# Patient Record
Sex: Female | Born: 1935
Health system: Southern US, Community
[De-identification: ages and names within clinical notes are randomized; demographics above are authoritative.]

## PROBLEM LIST (undated history)

## (undated) DIAGNOSIS — J45909 Unspecified asthma, uncomplicated: Secondary | ICD-10-CM

## (undated) HISTORY — PX: ECTOPIC PREGNANCY SURGERY: SHX613

## (undated) HISTORY — PX: TONSILLECTOMY: SUR1361

## (undated) HISTORY — PX: CATARACT EXTRACTION: SUR2

---

## 1998-06-06 ENCOUNTER — Ambulatory Visit (HOSPITAL_COMMUNITY): Admission: RE | Admit: 1998-06-06 | Discharge: 1998-06-06 | Payer: Self-pay | Admitting: *Deleted

## 1999-03-17 ENCOUNTER — Other Ambulatory Visit: Admission: RE | Admit: 1999-03-17 | Discharge: 1999-03-17 | Payer: Self-pay | Admitting: *Deleted

## 1999-06-11 ENCOUNTER — Encounter: Payer: Self-pay | Admitting: *Deleted

## 1999-06-11 ENCOUNTER — Ambulatory Visit (HOSPITAL_COMMUNITY): Admission: RE | Admit: 1999-06-11 | Discharge: 1999-06-11 | Payer: Self-pay | Admitting: *Deleted

## 2000-03-23 ENCOUNTER — Other Ambulatory Visit: Admission: RE | Admit: 2000-03-23 | Discharge: 2000-03-23 | Payer: Self-pay | Admitting: *Deleted

## 2000-06-14 ENCOUNTER — Ambulatory Visit (HOSPITAL_COMMUNITY): Admission: RE | Admit: 2000-06-14 | Discharge: 2000-06-14 | Payer: Self-pay | Admitting: *Deleted

## 2000-06-14 ENCOUNTER — Encounter: Payer: Self-pay | Admitting: *Deleted

## 2001-03-30 ENCOUNTER — Other Ambulatory Visit: Admission: RE | Admit: 2001-03-30 | Discharge: 2001-03-30 | Payer: Self-pay | Admitting: *Deleted

## 2001-06-16 ENCOUNTER — Encounter: Payer: Self-pay | Admitting: *Deleted

## 2001-06-16 ENCOUNTER — Ambulatory Visit (HOSPITAL_COMMUNITY): Admission: RE | Admit: 2001-06-16 | Discharge: 2001-06-16 | Payer: Self-pay | Admitting: *Deleted

## 2002-04-12 ENCOUNTER — Other Ambulatory Visit: Admission: RE | Admit: 2002-04-12 | Discharge: 2002-04-12 | Payer: Self-pay | Admitting: *Deleted

## 2002-06-27 ENCOUNTER — Ambulatory Visit (HOSPITAL_COMMUNITY): Admission: RE | Admit: 2002-06-27 | Discharge: 2002-06-27 | Payer: Self-pay | Admitting: *Deleted

## 2002-06-27 ENCOUNTER — Encounter: Payer: Self-pay | Admitting: *Deleted

## 2003-05-10 ENCOUNTER — Other Ambulatory Visit: Admission: RE | Admit: 2003-05-10 | Discharge: 2003-05-10 | Payer: Self-pay | Admitting: *Deleted

## 2003-06-29 ENCOUNTER — Encounter: Payer: Self-pay | Admitting: *Deleted

## 2003-06-29 ENCOUNTER — Ambulatory Visit (HOSPITAL_COMMUNITY): Admission: RE | Admit: 2003-06-29 | Discharge: 2003-06-29 | Payer: Self-pay | Admitting: *Deleted

## 2004-05-12 ENCOUNTER — Other Ambulatory Visit: Admission: RE | Admit: 2004-05-12 | Discharge: 2004-05-12 | Payer: Self-pay | Admitting: Obstetrics and Gynecology

## 2005-12-04 ENCOUNTER — Ambulatory Visit (HOSPITAL_COMMUNITY): Admission: RE | Admit: 2005-12-04 | Discharge: 2005-12-04 | Payer: Self-pay | Admitting: Gastroenterology

## 2011-12-24 DIAGNOSIS — H903 Sensorineural hearing loss, bilateral: Secondary | ICD-10-CM | POA: Diagnosis not present

## 2012-05-30 DIAGNOSIS — H2510 Age-related nuclear cataract, unspecified eye: Secondary | ICD-10-CM | POA: Diagnosis not present

## 2012-05-30 DIAGNOSIS — H26019 Infantile and juvenile cortical, lamellar, or zonular cataract, unspecified eye: Secondary | ICD-10-CM | POA: Diagnosis not present

## 2012-09-06 DIAGNOSIS — Z13820 Encounter for screening for osteoporosis: Secondary | ICD-10-CM | POA: Diagnosis not present

## 2012-09-06 DIAGNOSIS — Z130 Encounter for screening for diseases of the blood and blood-forming organs and certain disorders involving the immune mechanism: Secondary | ICD-10-CM | POA: Diagnosis not present

## 2012-09-06 DIAGNOSIS — Z01419 Encounter for gynecological examination (general) (routine) without abnormal findings: Secondary | ICD-10-CM | POA: Diagnosis not present

## 2012-09-06 DIAGNOSIS — Z1231 Encounter for screening mammogram for malignant neoplasm of breast: Secondary | ICD-10-CM | POA: Diagnosis not present

## 2012-09-06 DIAGNOSIS — M810 Age-related osteoporosis without current pathological fracture: Secondary | ICD-10-CM | POA: Diagnosis not present

## 2012-09-09 ENCOUNTER — Other Ambulatory Visit: Payer: Self-pay | Admitting: Obstetrics and Gynecology

## 2012-09-09 DIAGNOSIS — R928 Other abnormal and inconclusive findings on diagnostic imaging of breast: Secondary | ICD-10-CM

## 2012-09-14 ENCOUNTER — Ambulatory Visit
Admission: RE | Admit: 2012-09-14 | Discharge: 2012-09-14 | Disposition: A | Discharge status: Home / Self Care | Payer: Medicare Other | Source: Ambulatory Visit | Attending: Obstetrics and Gynecology | Admitting: Obstetrics and Gynecology

## 2012-09-14 DIAGNOSIS — R928 Other abnormal and inconclusive findings on diagnostic imaging of breast: Secondary | ICD-10-CM

## 2012-10-03 DIAGNOSIS — R05 Cough: Secondary | ICD-10-CM | POA: Diagnosis not present

## 2012-10-03 DIAGNOSIS — R059 Cough, unspecified: Secondary | ICD-10-CM | POA: Diagnosis not present

## 2012-10-03 DIAGNOSIS — Z23 Encounter for immunization: Secondary | ICD-10-CM | POA: Diagnosis not present

## 2012-10-17 DIAGNOSIS — R05 Cough: Secondary | ICD-10-CM | POA: Diagnosis not present

## 2012-10-17 DIAGNOSIS — R059 Cough, unspecified: Secondary | ICD-10-CM | POA: Diagnosis not present

## 2012-11-13 DIAGNOSIS — J019 Acute sinusitis, unspecified: Secondary | ICD-10-CM | POA: Diagnosis not present

## 2012-11-22 DIAGNOSIS — J321 Chronic frontal sinusitis: Secondary | ICD-10-CM | POA: Diagnosis not present

## 2012-11-22 DIAGNOSIS — M62838 Other muscle spasm: Secondary | ICD-10-CM | POA: Diagnosis not present

## 2013-03-09 DIAGNOSIS — J329 Chronic sinusitis, unspecified: Secondary | ICD-10-CM | POA: Diagnosis not present

## 2013-03-09 DIAGNOSIS — R059 Cough, unspecified: Secondary | ICD-10-CM | POA: Diagnosis not present

## 2013-03-09 DIAGNOSIS — R05 Cough: Secondary | ICD-10-CM | POA: Diagnosis not present

## 2013-06-05 DIAGNOSIS — H10509 Unspecified blepharoconjunctivitis, unspecified eye: Secondary | ICD-10-CM | POA: Diagnosis not present

## 2013-06-22 DIAGNOSIS — H2510 Age-related nuclear cataract, unspecified eye: Secondary | ICD-10-CM | POA: Diagnosis not present

## 2013-06-22 DIAGNOSIS — H26019 Infantile and juvenile cortical, lamellar, or zonular cataract, unspecified eye: Secondary | ICD-10-CM | POA: Diagnosis not present

## 2013-08-24 DIAGNOSIS — Z23 Encounter for immunization: Secondary | ICD-10-CM | POA: Diagnosis not present

## 2013-09-13 DIAGNOSIS — Z01419 Encounter for gynecological examination (general) (routine) without abnormal findings: Secondary | ICD-10-CM | POA: Diagnosis not present

## 2013-09-13 DIAGNOSIS — Z124 Encounter for screening for malignant neoplasm of cervix: Secondary | ICD-10-CM | POA: Diagnosis not present

## 2013-09-13 DIAGNOSIS — Z130 Encounter for screening for diseases of the blood and blood-forming organs and certain disorders involving the immune mechanism: Secondary | ICD-10-CM | POA: Diagnosis not present

## 2013-09-13 DIAGNOSIS — Z1231 Encounter for screening mammogram for malignant neoplasm of breast: Secondary | ICD-10-CM | POA: Diagnosis not present

## 2013-10-19 DIAGNOSIS — H10509 Unspecified blepharoconjunctivitis, unspecified eye: Secondary | ICD-10-CM | POA: Diagnosis not present

## 2013-10-30 DIAGNOSIS — R21 Rash and other nonspecific skin eruption: Secondary | ICD-10-CM | POA: Diagnosis not present

## 2013-11-17 DIAGNOSIS — J45901 Unspecified asthma with (acute) exacerbation: Secondary | ICD-10-CM | POA: Diagnosis not present

## 2013-11-20 DIAGNOSIS — J45909 Unspecified asthma, uncomplicated: Secondary | ICD-10-CM | POA: Diagnosis not present

## 2013-12-25 DIAGNOSIS — R509 Fever, unspecified: Secondary | ICD-10-CM | POA: Diagnosis not present

## 2014-01-05 DIAGNOSIS — R05 Cough: Secondary | ICD-10-CM | POA: Diagnosis not present

## 2014-01-05 DIAGNOSIS — R059 Cough, unspecified: Secondary | ICD-10-CM | POA: Diagnosis not present

## 2014-01-19 DIAGNOSIS — J45909 Unspecified asthma, uncomplicated: Secondary | ICD-10-CM | POA: Diagnosis not present

## 2014-01-19 DIAGNOSIS — R0789 Other chest pain: Secondary | ICD-10-CM | POA: Diagnosis not present

## 2014-04-12 DIAGNOSIS — Z23 Encounter for immunization: Secondary | ICD-10-CM | POA: Diagnosis not present

## 2014-05-15 DIAGNOSIS — T7840XA Allergy, unspecified, initial encounter: Secondary | ICD-10-CM | POA: Diagnosis not present

## 2014-06-23 DIAGNOSIS — L259 Unspecified contact dermatitis, unspecified cause: Secondary | ICD-10-CM | POA: Diagnosis not present

## 2014-08-13 DIAGNOSIS — IMO0002 Reserved for concepts with insufficient information to code with codable children: Secondary | ICD-10-CM | POA: Diagnosis not present

## 2014-08-13 DIAGNOSIS — H2510 Age-related nuclear cataract, unspecified eye: Secondary | ICD-10-CM | POA: Diagnosis not present

## 2014-08-15 DIAGNOSIS — H2510 Age-related nuclear cataract, unspecified eye: Secondary | ICD-10-CM | POA: Diagnosis not present

## 2014-08-15 DIAGNOSIS — IMO0002 Reserved for concepts with insufficient information to code with codable children: Secondary | ICD-10-CM | POA: Diagnosis not present

## 2014-08-22 DIAGNOSIS — IMO0002 Reserved for concepts with insufficient information to code with codable children: Secondary | ICD-10-CM | POA: Diagnosis not present

## 2014-08-22 DIAGNOSIS — H2510 Age-related nuclear cataract, unspecified eye: Secondary | ICD-10-CM | POA: Diagnosis not present

## 2014-08-29 DIAGNOSIS — H2510 Age-related nuclear cataract, unspecified eye: Secondary | ICD-10-CM | POA: Diagnosis not present

## 2014-08-29 DIAGNOSIS — IMO0002 Reserved for concepts with insufficient information to code with codable children: Secondary | ICD-10-CM | POA: Diagnosis not present

## 2014-10-12 DIAGNOSIS — Z23 Encounter for immunization: Secondary | ICD-10-CM | POA: Diagnosis not present

## 2014-10-17 DIAGNOSIS — N958 Other specified menopausal and perimenopausal disorders: Secondary | ICD-10-CM | POA: Diagnosis not present

## 2014-10-17 DIAGNOSIS — Z1231 Encounter for screening mammogram for malignant neoplasm of breast: Secondary | ICD-10-CM | POA: Diagnosis not present

## 2014-10-17 DIAGNOSIS — Z01419 Encounter for gynecological examination (general) (routine) without abnormal findings: Secondary | ICD-10-CM | POA: Diagnosis not present

## 2014-10-17 DIAGNOSIS — M8588 Other specified disorders of bone density and structure, other site: Secondary | ICD-10-CM | POA: Diagnosis not present

## 2014-12-31 DIAGNOSIS — Z961 Presence of intraocular lens: Secondary | ICD-10-CM | POA: Diagnosis not present

## 2015-01-07 ENCOUNTER — Emergency Department (HOSPITAL_COMMUNITY): Payer: No Typology Code available for payment source

## 2015-01-07 ENCOUNTER — Encounter (HOSPITAL_COMMUNITY): Payer: Self-pay | Admitting: Radiology

## 2015-01-07 ENCOUNTER — Observation Stay (HOSPITAL_COMMUNITY)
Admission: EM | Admit: 2015-01-07 | Discharge: 2015-01-09 | Disposition: A | Discharge status: Home / Self Care | Payer: No Typology Code available for payment source | Attending: General Surgery | Admitting: General Surgery

## 2015-01-07 DIAGNOSIS — J45909 Unspecified asthma, uncomplicated: Secondary | ICD-10-CM | POA: Diagnosis not present

## 2015-01-07 DIAGNOSIS — Y92488 Other paved roadways as the place of occurrence of the external cause: Secondary | ICD-10-CM | POA: Insufficient documentation

## 2015-01-07 DIAGNOSIS — R079 Chest pain, unspecified: Secondary | ICD-10-CM | POA: Diagnosis not present

## 2015-01-07 DIAGNOSIS — I700 Atherosclerosis of aorta: Secondary | ICD-10-CM | POA: Insufficient documentation

## 2015-01-07 DIAGNOSIS — Y9389 Activity, other specified: Secondary | ICD-10-CM | POA: Diagnosis not present

## 2015-01-07 DIAGNOSIS — Y998 Other external cause status: Secondary | ICD-10-CM | POA: Insufficient documentation

## 2015-01-07 DIAGNOSIS — S299XXA Unspecified injury of thorax, initial encounter: Secondary | ICD-10-CM | POA: Diagnosis not present

## 2015-01-07 DIAGNOSIS — S2220XA Unspecified fracture of sternum, initial encounter for closed fracture: Principal | ICD-10-CM | POA: Diagnosis present

## 2015-01-07 DIAGNOSIS — R002 Palpitations: Secondary | ICD-10-CM | POA: Diagnosis not present

## 2015-01-07 DIAGNOSIS — R0789 Other chest pain: Secondary | ICD-10-CM

## 2015-01-07 HISTORY — DX: Unspecified asthma, uncomplicated: J45.909

## 2015-01-07 LAB — CBC WITH DIFFERENTIAL/PLATELET
BASOS ABS: 0 10*3/uL (ref 0.0–0.1)
Basophils Relative: 0 % (ref 0–1)
EOS PCT: 5 % (ref 0–5)
Eosinophils Absolute: 0.4 10*3/uL (ref 0.0–0.7)
HCT: 42.9 % (ref 36.0–46.0)
HEMOGLOBIN: 14.2 g/dL (ref 12.0–15.0)
LYMPHS ABS: 1.9 10*3/uL (ref 0.7–4.0)
LYMPHS PCT: 26 % (ref 12–46)
MCH: 29.5 pg (ref 26.0–34.0)
MCHC: 33.1 g/dL (ref 30.0–36.0)
MCV: 89 fL (ref 78.0–100.0)
MONOS PCT: 5 % (ref 3–12)
Monocytes Absolute: 0.4 10*3/uL (ref 0.1–1.0)
Neutro Abs: 4.6 10*3/uL (ref 1.7–7.7)
Neutrophils Relative %: 64 % (ref 43–77)
Platelets: 237 10*3/uL (ref 150–400)
RBC: 4.82 MIL/uL (ref 3.87–5.11)
RDW: 12.9 % (ref 11.5–15.5)
WBC: 7.2 10*3/uL (ref 4.0–10.5)

## 2015-01-07 LAB — BASIC METABOLIC PANEL
ANION GAP: 7 (ref 5–15)
BUN: 9 mg/dL (ref 6–23)
CO2: 27 mmol/L (ref 19–32)
CREATININE: 0.85 mg/dL (ref 0.50–1.10)
Calcium: 8.9 mg/dL (ref 8.4–10.5)
Chloride: 105 mEq/L (ref 96–112)
GFR calc non Af Amer: 65 mL/min — ABNORMAL LOW (ref >90)
GFR, EST AFRICAN AMERICAN: 75 mL/min — AB (ref >90)
Glucose, Bld: 129 mg/dL — ABNORMAL HIGH (ref 70–99)
Potassium: 3.9 mmol/L (ref 3.5–5.1)
SODIUM: 139 mmol/L (ref 135–145)

## 2015-01-07 MED ORDER — ONDANSETRON HCL 4 MG/2ML IJ SOLN
4.0000 mg | Freq: Once | INTRAMUSCULAR | Status: AC
  Administered 2015-01-07: 4 mg via INTRAVENOUS
  Filled 2015-01-07: qty 2

## 2015-01-07 MED ORDER — IOHEXOL 300 MG/ML  SOLN
80.0000 mL | Freq: Once | INTRAMUSCULAR | Status: AC | PRN
  Administered 2015-01-07: 80 mL via INTRAVENOUS

## 2015-01-07 MED ORDER — OXYCODONE HCL 5 MG PO TABS
5.0000 mg | ORAL_TABLET | ORAL | Status: DC | PRN
  Administered 2015-01-07 – 2015-01-08 (×3): 5 mg via ORAL
  Filled 2015-01-07 (×3): qty 1

## 2015-01-07 MED ORDER — SODIUM CHLORIDE 0.9 % IV SOLN: 250.0000 mL | INTRAVENOUS | Status: DC | PRN

## 2015-01-07 MED ORDER — ENOXAPARIN SODIUM 40 MG/0.4ML ~~LOC~~ SOLN
40.0000 mg | SUBCUTANEOUS | Status: DC
  Administered 2015-01-07 – 2015-01-08 (×2): 40 mg via SUBCUTANEOUS
  Filled 2015-01-07 (×3): qty 0.4

## 2015-01-07 MED ORDER — FENTANYL CITRATE 0.05 MG/ML IJ SOLN
50.0000 ug | Freq: Once | INTRAMUSCULAR | Status: AC
  Administered 2015-01-07: 50 ug via INTRAVENOUS
  Filled 2015-01-07: qty 2

## 2015-01-07 MED ORDER — HYDROMORPHONE HCL 1 MG/ML IJ SOLN: 1.0000 mg | INTRAMUSCULAR | Status: DC | PRN

## 2015-01-07 MED ORDER — ONDANSETRON HCL 4 MG/2ML IJ SOLN: 4.0000 mg | Freq: Four times a day (QID) | INTRAMUSCULAR | Status: DC | PRN

## 2015-01-07 MED ORDER — ONDANSETRON HCL 4 MG PO TABS
4.0000 mg | ORAL_TABLET | Freq: Four times a day (QID) | ORAL | Status: DC | PRN
  Administered 2015-01-08: 4 mg via ORAL
  Filled 2015-01-07: qty 1

## 2015-01-07 MED ORDER — MORPHINE SULFATE 4 MG/ML IJ SOLN
4.0000 mg | Freq: Once | INTRAMUSCULAR | Status: AC
  Administered 2015-01-07: 4 mg via INTRAVENOUS
  Filled 2015-01-07: qty 1

## 2015-01-07 MED ORDER — SODIUM CHLORIDE 0.9 % IJ SOLN
3.0000 mL | Freq: Two times a day (BID) | INTRAMUSCULAR | Status: DC
  Administered 2015-01-07 – 2015-01-09 (×4): 3 mL via INTRAVENOUS

## 2015-01-07 MED ORDER — SODIUM CHLORIDE 0.9 % IJ SOLN: 3.0000 mL | INTRAMUSCULAR | Status: DC | PRN

## 2015-01-07 NOTE — ED Notes (Signed)
Patient declines pain medication at this time, states the medications make her nauseated despite Zofran coverage.
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
## 2015-01-07 NOTE — ED Notes (Signed)
Patient back from xray, c/o pain in chest again. Monitor reapplied
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
## 2015-01-07 NOTE — ED Notes (Signed)
Dr Zavitz at bedside  

## 2015-01-08 ENCOUNTER — Observation Stay (HOSPITAL_COMMUNITY): Payer: No Typology Code available for payment source

## 2015-01-08 ENCOUNTER — Encounter (HOSPITAL_COMMUNITY): Payer: Self-pay | Admitting: General Practice

## 2015-01-08 DIAGNOSIS — S2220XD Unspecified fracture of sternum, subsequent encounter for fracture with routine healing: Secondary | ICD-10-CM | POA: Diagnosis not present

## 2015-01-08 DIAGNOSIS — S2220XA Unspecified fracture of sternum, initial encounter for closed fracture: Secondary | ICD-10-CM | POA: Diagnosis not present

## 2015-01-08 MED ORDER — MORPHINE SULFATE 2 MG/ML IJ SOLN: 2.0000 mg | INTRAMUSCULAR | Status: DC | PRN

## 2015-01-08 MED ORDER — NAPROXEN 500 MG PO TABS
500.0000 mg | ORAL_TABLET | Freq: Two times a day (BID) | ORAL | Status: DC
  Administered 2015-01-09: 500 mg via ORAL
  Filled 2015-01-08 (×4): qty 1

## 2015-01-08 MED ORDER — TRAMADOL HCL 50 MG PO TABS
50.0000 mg | ORAL_TABLET | Freq: Four times a day (QID) | ORAL | Status: DC | PRN
  Administered 2015-01-08: 100 mg via ORAL
  Administered 2015-01-08: 50 mg via ORAL
  Administered 2015-01-09: 100 mg via ORAL
  Filled 2015-01-08: qty 2
  Filled 2015-01-08: qty 1
  Filled 2015-01-08: qty 2

## 2015-01-08 MED ORDER — PROMETHAZINE HCL 25 MG/ML IJ SOLN
12.5000 mg | Freq: Once | INTRAMUSCULAR | Status: AC
  Administered 2015-01-08: 12.5 mg via INTRAVENOUS
  Filled 2015-01-08: qty 1

## 2015-01-08 NOTE — Progress Notes (Signed)
UR completed 
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
## 2015-01-09 DIAGNOSIS — S2220XA Unspecified fracture of sternum, initial encounter for closed fracture: Secondary | ICD-10-CM | POA: Diagnosis not present

## 2015-01-09 MED ORDER — NAPROXEN 500 MG PO TABS
500.0000 mg | ORAL_TABLET | Freq: Two times a day (BID) | ORAL | Status: DC
Start: 2015-01-09 — End: 2022-04-02

## 2015-01-09 MED ORDER — HYDROCODONE-ACETAMINOPHEN 10-325 MG PO TABS: 0.5000 | ORAL_TABLET | ORAL | Status: DC | PRN

## 2015-01-09 MED ORDER — ONDANSETRON HCL 4 MG PO TABS
4.0000 mg | ORAL_TABLET | ORAL | Status: DC
  Administered 2015-01-09 (×2): 4 mg via ORAL
  Filled 2015-01-09 (×2): qty 1

## 2015-01-09 MED ORDER — TRAMADOL HCL 50 MG PO TABS: 100.0000 mg | ORAL_TABLET | Freq: Four times a day (QID) | ORAL | Status: DC

## 2015-01-09 MED ORDER — TRAMADOL HCL 50 MG PO TABS
100.0000 mg | ORAL_TABLET | Freq: Four times a day (QID) | ORAL | Status: DC
  Administered 2015-01-09 (×2): 100 mg via ORAL
  Filled 2015-01-09 (×2): qty 2

## 2015-01-09 MED ORDER — HYDROCODONE-ACETAMINOPHEN 5-325 MG PO TABS: 1.0000 | ORAL_TABLET | ORAL | Status: DC | PRN

## 2015-01-09 MED ORDER — ONDANSETRON HCL 4 MG PO TABS: 4.0000 mg | ORAL_TABLET | ORAL | Status: DC

## 2015-01-09 NOTE — Discharge Instructions (Signed)
Increase activity as pain allows.
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
## 2015-01-09 NOTE — Progress Notes (Signed)
UR completed 

## 2015-01-10 ENCOUNTER — Telehealth (HOSPITAL_COMMUNITY): Payer: Self-pay

## 2015-01-10 NOTE — Telephone Encounter (Signed)
They had not seen the hard copy of one of their prescriptions but had taken care of it prior to my call.

## 2015-01-14 DIAGNOSIS — S2222XD Fracture of body of sternum, subsequent encounter for fracture with routine healing: Secondary | ICD-10-CM | POA: Diagnosis not present

## 2015-01-28 DIAGNOSIS — R079 Chest pain, unspecified: Secondary | ICD-10-CM | POA: Diagnosis not present

## 2015-01-28 DIAGNOSIS — I499 Cardiac arrhythmia, unspecified: Secondary | ICD-10-CM | POA: Diagnosis not present

## 2015-02-18 DIAGNOSIS — I499 Cardiac arrhythmia, unspecified: Secondary | ICD-10-CM | POA: Diagnosis not present

## 2015-02-18 DIAGNOSIS — R5381 Other malaise: Secondary | ICD-10-CM | POA: Diagnosis not present

## 2015-02-26 DIAGNOSIS — M545 Low back pain: Secondary | ICD-10-CM | POA: Diagnosis not present

## 2015-02-27 ENCOUNTER — Ambulatory Visit: Payer: Medicare Other | Attending: Family Medicine

## 2015-02-27 DIAGNOSIS — R6889 Other general symptoms and signs: Secondary | ICD-10-CM | POA: Diagnosis not present

## 2015-02-27 DIAGNOSIS — M545 Low back pain, unspecified: Secondary | ICD-10-CM

## 2015-02-27 DIAGNOSIS — J45909 Unspecified asthma, uncomplicated: Secondary | ICD-10-CM | POA: Diagnosis not present
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
## 2015-02-27 NOTE — Patient Instructions (Signed)
Perform all exercises below:  Hold _20___ seconds. Repeat _3___ times.  Do __3__ sessions per day. CAUTION: Movement should be gentle, steady and slow.  Knee to Chest  Lying supine, bend involved knee to chest. Perform with each leg.  Copyright  VHI. All rights reserved.  Double Knee to Chest (Flexion)   Gently pull both knees toward chest. Feel stretch in lower back or buttock area. Breathing deeply, Lumbar Rotation: Caudal - Bilateral (Supine)  Feet and knees together, arms outstretched, rotate knees left, turning head in opposite direction, until stretch is felt.

## 2015-03-04 DIAGNOSIS — J069 Acute upper respiratory infection, unspecified: Secondary | ICD-10-CM | POA: Diagnosis not present

## 2015-03-06 ENCOUNTER — Ambulatory Visit: Payer: Medicare Other | Admitting: Physical Therapy

## 2015-03-12 ENCOUNTER — Ambulatory Visit: Payer: Medicare Other

## 2015-03-12 DIAGNOSIS — M545 Low back pain, unspecified: Secondary | ICD-10-CM

## 2015-03-12 DIAGNOSIS — J45909 Unspecified asthma, uncomplicated: Secondary | ICD-10-CM | POA: Diagnosis not present

## 2015-03-12 DIAGNOSIS — R6889 Other general symptoms and signs: Secondary | ICD-10-CM | POA: Diagnosis not present

## 2015-03-12 NOTE — Patient Instructions (Signed)
   Lifting Principles  .Maintain proper posture and head alignment. .Slide object as close as possible before lifting. .Move obstacles out of the way. .Test before lifting; ask for help if too heavy. .Tighten stomach muscles without holding breath. .Use smooth movements; do not jerk. .Use legs to do the work, and pivot with feet. .Distribute the work load symmetrically and close to the center of trunk. .Push instead of pull whenever possible.   Squat down and hold basket close to stand. Use leg muscles to do the work.    Avoid twisting or bending back. Pivot around using foot movements, and bend at knees if needed when reaching for articles.        Getting Into / Out of Bed   Lower self to lie down on one side by raising legs and lowering head at the same time. Use arms to assist moving without twisting. Bend both knees to roll onto back if desired. To sit up, start from lying on side, and use same move-ments in reverse. Keep trunk aligned with legs.    Shift weight from front foot to back foot as item is lifted off shelf.    When leaning forward to pick object up from floor, extend one leg out behind. Keep back straight. Hold onto a sturdy support with other hand.      Sit upright, head facing forward. Try using a roll to support lower back. Keep shoulders relaxed, and avoid rounded back. Keep hips level with knees. Avoid crossing legs for long periods.    HIP: Hamstrings - Short Sitting   Rest leg on raised surface. Keep knee straight. Lift chest. Hold _20__ seconds. _3__ reps per set, _3__ sets per day.  CHEST: Doorway, Bilateral - Do this standing   Sitting in doorway, place hands on wall with elbows bent at shoulder height. Lean forward. Hold _10__ seconds. _5__ reps per set, __3_ sets per day.  Copyright  VHI. All rights reserved.   Copyright  VHI. All rights reserved.
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
## 2015-03-20 ENCOUNTER — Ambulatory Visit: Payer: Medicare Other

## 2015-03-20 DIAGNOSIS — R6889 Other general symptoms and signs: Secondary | ICD-10-CM | POA: Diagnosis not present

## 2015-03-20 DIAGNOSIS — M545 Low back pain, unspecified: Secondary | ICD-10-CM

## 2015-03-20 DIAGNOSIS — J45909 Unspecified asthma, uncomplicated: Secondary | ICD-10-CM | POA: Diagnosis not present
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
## 2015-03-20 NOTE — Patient Instructions (Signed)
KEEP HEAD IN NEUTRAL AND SHOULDERS DOWN AND RELAXED   Hold tubing in both arms. Pull arm back and past your hips elbow straight. Repeat __10__ times per set. Do __2__ sets per session. Do _1-2___ sessions per day.  Copyright  VHI. All rights reserved.     With resistive band anchored in door, grasp both ends. Keeping elbows bent, pull back, squeezing shoulder blades together. Hold _3__ seconds. Repeat _2x10___ times. Do _1-2___ sessions per day.  [email or web address].[email or web address]

## 2015-03-21 ENCOUNTER — Ambulatory Visit: Payer: Medicare Other

## 2015-03-21 DIAGNOSIS — J45909 Unspecified asthma, uncomplicated: Secondary | ICD-10-CM | POA: Diagnosis not present

## 2015-03-21 DIAGNOSIS — M545 Low back pain, unspecified: Secondary | ICD-10-CM

## 2015-03-21 DIAGNOSIS — R6889 Other general symptoms and signs: Secondary | ICD-10-CM
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
## 2015-03-25 ENCOUNTER — Ambulatory Visit: Payer: No Typology Code available for payment source | Attending: Family Medicine | Admitting: Physical Therapy

## 2015-03-25 ENCOUNTER — Encounter: Payer: Self-pay | Admitting: Physical Therapy

## 2015-03-25 DIAGNOSIS — J45909 Unspecified asthma, uncomplicated: Secondary | ICD-10-CM | POA: Insufficient documentation

## 2015-03-25 DIAGNOSIS — R6889 Other general symptoms and signs: Secondary | ICD-10-CM | POA: Diagnosis not present

## 2015-03-25 DIAGNOSIS — M545 Low back pain, unspecified: Secondary | ICD-10-CM
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
## 2015-03-27 ENCOUNTER — Encounter: Payer: Self-pay | Admitting: Physical Therapy

## 2015-03-27 ENCOUNTER — Ambulatory Visit: Payer: No Typology Code available for payment source | Admitting: Physical Therapy

## 2015-03-27 DIAGNOSIS — J45909 Unspecified asthma, uncomplicated: Secondary | ICD-10-CM | POA: Diagnosis not present

## 2015-03-27 DIAGNOSIS — M545 Low back pain, unspecified: Secondary | ICD-10-CM

## 2015-03-27 DIAGNOSIS — R6889 Other general symptoms and signs: Secondary | ICD-10-CM
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
## 2015-04-01 ENCOUNTER — Ambulatory Visit: Payer: No Typology Code available for payment source | Admitting: Physical Therapy

## 2015-04-03 ENCOUNTER — Encounter: Payer: Self-pay | Admitting: Physical Therapy

## 2015-04-03 ENCOUNTER — Ambulatory Visit: Payer: No Typology Code available for payment source | Admitting: Physical Therapy

## 2015-04-03 DIAGNOSIS — M545 Low back pain, unspecified: Secondary | ICD-10-CM

## 2015-04-03 DIAGNOSIS — R6889 Other general symptoms and signs: Secondary | ICD-10-CM

## 2015-04-03 DIAGNOSIS — J45909 Unspecified asthma, uncomplicated: Secondary | ICD-10-CM | POA: Diagnosis not present
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
## 2015-04-09 DIAGNOSIS — S2220XD Unspecified fracture of sternum, subsequent encounter for fracture with routine healing: Secondary | ICD-10-CM | POA: Diagnosis not present

## 2015-04-09 DIAGNOSIS — M791 Myalgia: Secondary | ICD-10-CM | POA: Diagnosis not present

## 2015-09-17 DIAGNOSIS — M940 Chondrocostal junction syndrome [Tietze]: Secondary | ICD-10-CM | POA: Diagnosis not present

## 2015-09-17 DIAGNOSIS — Z23 Encounter for immunization: Secondary | ICD-10-CM | POA: Diagnosis not present

## 2015-10-03 ENCOUNTER — Ambulatory Visit: Payer: No Typology Code available for payment source | Attending: Family Medicine

## 2015-10-03 DIAGNOSIS — R293 Abnormal posture: Secondary | ICD-10-CM | POA: Diagnosis not present

## 2015-10-03 DIAGNOSIS — R0781 Pleurodynia: Secondary | ICD-10-CM | POA: Insufficient documentation

## 2015-10-03 DIAGNOSIS — R0789 Other chest pain: Secondary | ICD-10-CM

## 2015-10-03 NOTE — Patient Instructions (Signed)
Cervico-Thoracic: Extension / Rotation (Sitting)    Reach across body with left arm and grasp back of chair. Gently look over right side shoulder. Hold _20___ seconds. Relax. Repeat _3___ times per set. Do ___1_ sets per session. Do _3___ sessions per day.  http://orth.exer.us/981   Copyright  VHI. All rights reserved.  Flexibility: Corner Stretch    Standing in corner with hands just above shoulder level and feet ____ inches from corner, lean forward until a comfortable stretch is felt across chest. Hold 20____ seconds. Repeat __3__ times per set. Do _1___ sets per session. Do ___3_ sessions per day.  http://orth.exer.us/343   Copyright  VHI. All rights reserved.  Flexion (Assistive)    Hold arms shoulder distance apart  and raise arms above head, keeping elbows as straight as possible. Can be done sitting or lying.  Hold 10 seconds Repeat _10___ times. Do _3___ sessions per day.  Copyright  VHI. All rights reserved.  [facility] [address] [place], [place] [address] Phone # [phone] Fax [phone]
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
## 2015-10-07 ENCOUNTER — Encounter: Payer: Self-pay | Admitting: Physical Therapy

## 2015-10-07 ENCOUNTER — Ambulatory Visit: Payer: No Typology Code available for payment source | Admitting: Physical Therapy

## 2015-10-07 DIAGNOSIS — R293 Abnormal posture: Secondary | ICD-10-CM | POA: Diagnosis not present

## 2015-10-07 DIAGNOSIS — R0789 Other chest pain: Secondary | ICD-10-CM

## 2015-10-07 DIAGNOSIS — R0781 Pleurodynia: Secondary | ICD-10-CM | POA: Diagnosis not present
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
## 2015-10-07 NOTE — Patient Instructions (Signed)
   AT home lay comfortably on your back, knees bent or propped up on something to help you relax.  Put your hands on your sternum bone, press gently into the bone and do small/SLOW circles. DO 5-10 clockwise then 5-10 the other direction.   Second exercise: Hands remain on your sternum. Inhale, exhale and gently press down into the bone and then push in down towards towards your belly button. Do not push hard, gentle is all you have to do. Do 5x super slow.   Diaphragmatic - Supine    Inhale through nose making navel move out toward hands. Exhale through puckered lips, hands follow navel in. Repeat _5__ times. Rest _10__ seconds between repeats. Do 3___ times per day. Then practice with your hands on your ribs, breathe out into the ribs laterally on an inhale, exhale relax. Do this 5x.   Copyright  VHI. All rights reserved.

## 2015-10-17 DIAGNOSIS — I499 Cardiac arrhythmia, unspecified: Secondary | ICD-10-CM | POA: Diagnosis not present

## 2015-10-17 DIAGNOSIS — J4520 Mild intermittent asthma, uncomplicated: Secondary | ICD-10-CM | POA: Diagnosis not present

## 2015-11-08 DIAGNOSIS — R05 Cough: Secondary | ICD-10-CM | POA: Diagnosis not present

## 2016-01-16 DIAGNOSIS — Z961 Presence of intraocular lens: Secondary | ICD-10-CM | POA: Diagnosis not present

## 2016-01-21 DIAGNOSIS — F4320 Adjustment disorder, unspecified: Secondary | ICD-10-CM | POA: Diagnosis not present

## 2016-01-21 DIAGNOSIS — R202 Paresthesia of skin: Secondary | ICD-10-CM | POA: Diagnosis not present

## 2016-05-14 ENCOUNTER — Other Ambulatory Visit: Payer: Self-pay | Admitting: Family Medicine

## 2016-05-14 DIAGNOSIS — J4520 Mild intermittent asthma, uncomplicated: Secondary | ICD-10-CM | POA: Diagnosis not present

## 2016-05-14 DIAGNOSIS — E0789 Other specified disorders of thyroid: Secondary | ICD-10-CM

## 2016-05-14 DIAGNOSIS — Z131 Encounter for screening for diabetes mellitus: Secondary | ICD-10-CM | POA: Diagnosis not present

## 2016-05-14 DIAGNOSIS — Z0000 Encounter for general adult medical examination without abnormal findings: Secondary | ICD-10-CM | POA: Diagnosis not present

## 2016-05-14 DIAGNOSIS — Z13220 Encounter for screening for lipoid disorders: Secondary | ICD-10-CM | POA: Diagnosis not present

## 2016-05-14 DIAGNOSIS — Z136 Encounter for screening for cardiovascular disorders: Secondary | ICD-10-CM | POA: Diagnosis not present

## 2016-05-14 DIAGNOSIS — C4491 Basal cell carcinoma of skin, unspecified: Secondary | ICD-10-CM | POA: Diagnosis not present

## 2016-05-28 DIAGNOSIS — C44519 Basal cell carcinoma of skin of other part of trunk: Secondary | ICD-10-CM | POA: Diagnosis not present

## 2016-05-28 DIAGNOSIS — R21 Rash and other nonspecific skin eruption: Secondary | ICD-10-CM | POA: Diagnosis not present

## 2016-06-18 ENCOUNTER — Ambulatory Visit
Admission: RE | Admit: 2016-06-18 | Discharge: 2016-06-18 | Disposition: A | Discharge status: Home / Self Care | Payer: Medicare Other | Source: Ambulatory Visit | Attending: Family Medicine | Admitting: Family Medicine

## 2016-06-18 DIAGNOSIS — E041 Nontoxic single thyroid nodule: Secondary | ICD-10-CM | POA: Diagnosis not present

## 2016-06-18 DIAGNOSIS — E0789 Other specified disorders of thyroid: Secondary | ICD-10-CM

## 2016-09-21 DIAGNOSIS — Z23 Encounter for immunization: Secondary | ICD-10-CM | POA: Diagnosis not present

## 2016-11-05 DIAGNOSIS — N390 Urinary tract infection, site not specified: Secondary | ICD-10-CM | POA: Diagnosis not present

## 2016-11-05 DIAGNOSIS — N958 Other specified menopausal and perimenopausal disorders: Secondary | ICD-10-CM | POA: Diagnosis not present

## 2016-11-05 DIAGNOSIS — Z681 Body mass index (BMI) 19 or less, adult: Secondary | ICD-10-CM | POA: Diagnosis not present

## 2016-11-05 DIAGNOSIS — Z1231 Encounter for screening mammogram for malignant neoplasm of breast: Secondary | ICD-10-CM | POA: Diagnosis not present

## 2016-11-05 DIAGNOSIS — Z01419 Encounter for gynecological examination (general) (routine) without abnormal findings: Secondary | ICD-10-CM | POA: Diagnosis not present

## 2016-11-05 DIAGNOSIS — Z124 Encounter for screening for malignant neoplasm of cervix: Secondary | ICD-10-CM | POA: Diagnosis not present

## 2016-11-25 DIAGNOSIS — M859 Disorder of bone density and structure, unspecified: Secondary | ICD-10-CM | POA: Diagnosis not present

## 2016-12-01 DIAGNOSIS — M810 Age-related osteoporosis without current pathological fracture: Secondary | ICD-10-CM | POA: Diagnosis not present

## 2016-12-25 ENCOUNTER — Emergency Department (HOSPITAL_COMMUNITY): Payer: Medicare Other

## 2016-12-25 ENCOUNTER — Encounter (HOSPITAL_COMMUNITY): Payer: Self-pay | Admitting: Emergency Medicine

## 2016-12-25 ENCOUNTER — Observation Stay (HOSPITAL_COMMUNITY)
Admission: EM | Admit: 2016-12-25 | Discharge: 2016-12-25 | Disposition: A | Discharge status: Home / Self Care | Payer: Medicare Other | Attending: Emergency Medicine | Admitting: Emergency Medicine

## 2016-12-25 DIAGNOSIS — S0990XA Unspecified injury of head, initial encounter: Secondary | ICD-10-CM | POA: Diagnosis not present

## 2016-12-25 DIAGNOSIS — R55 Syncope and collapse: Secondary | ICD-10-CM | POA: Diagnosis not present

## 2016-12-25 DIAGNOSIS — S0190XA Unspecified open wound of unspecified part of head, initial encounter: Secondary | ICD-10-CM | POA: Diagnosis not present

## 2016-12-25 DIAGNOSIS — Z791 Long term (current) use of non-steroidal anti-inflammatories (NSAID): Secondary | ICD-10-CM | POA: Insufficient documentation

## 2016-12-25 DIAGNOSIS — S0101XA Laceration without foreign body of scalp, initial encounter: Secondary | ICD-10-CM | POA: Insufficient documentation

## 2016-12-25 DIAGNOSIS — Y92512 Supermarket, store or market as the place of occurrence of the external cause: Secondary | ICD-10-CM | POA: Insufficient documentation

## 2016-12-25 DIAGNOSIS — J329 Chronic sinusitis, unspecified: Secondary | ICD-10-CM | POA: Diagnosis not present

## 2016-12-25 DIAGNOSIS — R05 Cough: Secondary | ICD-10-CM | POA: Diagnosis not present

## 2016-12-25 DIAGNOSIS — W1839XA Other fall on same level, initial encounter: Secondary | ICD-10-CM | POA: Insufficient documentation

## 2016-12-25 DIAGNOSIS — S199XXA Unspecified injury of neck, initial encounter: Secondary | ICD-10-CM | POA: Diagnosis not present

## 2016-12-25 DIAGNOSIS — Y9389 Activity, other specified: Secondary | ICD-10-CM | POA: Diagnosis not present

## 2016-12-25 LAB — URINALYSIS, ROUTINE W REFLEX MICROSCOPIC
Bilirubin Urine: NEGATIVE
Glucose, UA: NEGATIVE mg/dL
Hgb urine dipstick: NEGATIVE
Ketones, ur: 80 mg/dL — AB
Leukocytes, UA: NEGATIVE
Nitrite: NEGATIVE
Protein, ur: 100 mg/dL — AB
Specific Gravity, Urine: 1.024 (ref 1.005–1.030)
pH: 5 (ref 5.0–8.0)

## 2016-12-25 LAB — BASIC METABOLIC PANEL
ANION GAP: 12 (ref 5–15)
BUN: 13 mg/dL (ref 6–20)
CO2: 23 mmol/L (ref 22–32)
Calcium: 9 mg/dL (ref 8.9–10.3)
Chloride: 102 mmol/L (ref 101–111)
Creatinine, Ser: 0.96 mg/dL (ref 0.44–1.00)
GFR calc Af Amer: >60 mL/min (ref >60)
GFR, EST NON AFRICAN AMERICAN: 55 mL/min — AB (ref >60)
GLUCOSE: 94 mg/dL (ref 65–99)
Potassium: 4.7 mmol/L (ref 3.5–5.1)
Sodium: 137 mmol/L (ref 135–145)

## 2016-12-25 LAB — CBC
HCT: 46.2 % — ABNORMAL HIGH (ref 36.0–46.0)
Hemoglobin: 15.6 g/dL — ABNORMAL HIGH (ref 12.0–15.0)
MCH: 29.8 pg (ref 26.0–34.0)
MCHC: 33.8 g/dL (ref 30.0–36.0)
MCV: 88.2 fL (ref 78.0–100.0)
PLATELETS: 190 10*3/uL (ref 150–400)
RBC: 5.24 MIL/uL — ABNORMAL HIGH (ref 3.87–5.11)
RDW: 13.1 % (ref 11.5–15.5)
WBC: 5.2 10*3/uL (ref 4.0–10.5)

## 2016-12-25 LAB — TROPONIN I: Troponin I: <0.03 ng/mL (ref <0.03)

## 2016-12-25 LAB — CBG MONITORING, ED: GLUCOSE-CAPILLARY: 101 mg/dL — AB (ref 65–99)

## 2016-12-25 MED ORDER — FLUTICASONE PROPIONATE 50 MCG/ACT NA SUSP: 1.0000 | Freq: Every day | NASAL | 2 refills | Status: DC

## 2016-12-25 MED ORDER — IPRATROPIUM-ALBUTEROL 0.5-2.5 (3) MG/3ML IN SOLN
3.0000 mL | Freq: Once | RESPIRATORY_TRACT | Status: AC
  Administered 2016-12-25: 3 mL via RESPIRATORY_TRACT
  Filled 2016-12-25: qty 3

## 2016-12-25 MED ORDER — SODIUM CHLORIDE 0.9 % IV BOLUS (SEPSIS)
1000.0000 mL | Freq: Once | INTRAVENOUS | Status: AC
  Administered 2016-12-25: 1000 mL via INTRAVENOUS

## 2016-12-25 NOTE — ED Notes (Signed)
Patient transported to X-ray 
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
## 2016-12-25 NOTE — ED Notes (Signed)
Papers and prescriptions reviewed with patient and she verbalizes understanding. Patient was set fora dmission and got a bed quickly, however, they decided to DC instead. PA discontinued admission order
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
## 2016-12-25 NOTE — ED Notes (Signed)
Patient transported to CT 

## 2016-12-25 NOTE — Progress Notes (Addendum)
81 y/o ? Sparse medical illnesses Syncopal episode at [facility] shot of rocephin and sinus med saw pcp Dr. Harrington Challenger 12/25/16 and given rocephin and Rx azithro Felt quesy after injection and was feeling poorly and uncomfortable--had to wait a long time in the [place] for the meds Got up, went to counter, felt like needed to throw up  No other symptoms Fell backward and hit head and sustained laceration Awoke right away without any sequelae-no post-ictal state, no loss of urine, no LOC, no twitching  Wbc 5, hb 15, plt 190 Sodium 137, bun/creat 13/0.96 Ketones in urine cxr neg Ct head and spine ned  Lac repaired in ED  PMA HA in father Mother thryoid  nkda  Never smoker never drinker  High school grad Worked at [employer] 20 yr Husband passed 1 yr prior  Still drives and does for herself what is needed   eomi mncat Small laceration with staples post R occiput EOMI, ncat cta b, no added sound, no noted arrhythmias on monitor in ED Finger-nose-finger intact Power 5/5, senosry intact Reflexes 2/3  Simple vaso-vagal syncope I have evaluated the patient and had a through discussion with her about my thoughts that this is a SIMPLE VASO-VAGAL or Neuro-Cardiogenic syncope given circumstances and history--there is no real specific test for syncope but history She has ambulated around the unit 2 [>80 feet] continuously and has felt ok  Her HR is a little high She has received IVF in the ED I would recommend rpt orthostatics and follow up with PCP for staple removal ~ 7 days Flonase might also help sinus issues I had a long discussion with her and with her famiyl and we feel she is reasonably stable to d/c home  recommend follow up/discussion Dr. Harrington Challenger prior to long distance driving  Dw Mr. Marlon Pel, PA-C   Verneita Griffes, MD [facility] [phone]

## 2016-12-25 NOTE — ED Notes (Signed)
Informed pt. We needed her urine. She was unable to urinate. Will try again later

## 2017-01-01 DIAGNOSIS — J069 Acute upper respiratory infection, unspecified: Secondary | ICD-10-CM | POA: Diagnosis not present

## 2017-01-01 DIAGNOSIS — S0101XA Laceration without foreign body of scalp, initial encounter: Secondary | ICD-10-CM | POA: Diagnosis not present

## 2017-01-01 DIAGNOSIS — R55 Syncope and collapse: Secondary | ICD-10-CM | POA: Diagnosis not present

## 2017-01-13 DIAGNOSIS — S0101XD Laceration without foreign body of scalp, subsequent encounter: Secondary | ICD-10-CM | POA: Diagnosis not present

## 2017-01-13 DIAGNOSIS — R55 Syncope and collapse: Secondary | ICD-10-CM | POA: Diagnosis not present

## 2017-04-07 DIAGNOSIS — R109 Unspecified abdominal pain: Secondary | ICD-10-CM | POA: Diagnosis not present

## 2017-06-02 DIAGNOSIS — Z131 Encounter for screening for diabetes mellitus: Secondary | ICD-10-CM | POA: Diagnosis not present

## 2017-06-02 DIAGNOSIS — Z0000 Encounter for general adult medical examination without abnormal findings: Secondary | ICD-10-CM | POA: Diagnosis not present

## 2017-06-18 DIAGNOSIS — W57XXXA Bitten or stung by nonvenomous insect and other nonvenomous arthropods, initial encounter: Secondary | ICD-10-CM | POA: Diagnosis not present

## 2017-06-18 DIAGNOSIS — S1096XA Insect bite of unspecified part of neck, initial encounter: Secondary | ICD-10-CM | POA: Diagnosis not present

## 2017-07-15 DIAGNOSIS — Z961 Presence of intraocular lens: Secondary | ICD-10-CM | POA: Diagnosis not present

## 2017-09-09 DIAGNOSIS — Z23 Encounter for immunization: Secondary | ICD-10-CM | POA: Diagnosis not present

## 2018-06-22 DIAGNOSIS — S80869A Insect bite (nonvenomous), unspecified lower leg, initial encounter: Secondary | ICD-10-CM | POA: Diagnosis not present

## 2018-06-22 DIAGNOSIS — W57XXXA Bitten or stung by nonvenomous insect and other nonvenomous arthropods, initial encounter: Secondary | ICD-10-CM | POA: Diagnosis not present

## 2018-07-07 ENCOUNTER — Other Ambulatory Visit: Payer: Self-pay | Admitting: Family Medicine

## 2018-07-07 DIAGNOSIS — R636 Underweight: Secondary | ICD-10-CM | POA: Diagnosis not present

## 2018-07-07 DIAGNOSIS — Z1231 Encounter for screening mammogram for malignant neoplasm of breast: Secondary | ICD-10-CM

## 2018-07-07 DIAGNOSIS — R21 Rash and other nonspecific skin eruption: Secondary | ICD-10-CM | POA: Diagnosis not present

## 2018-07-07 DIAGNOSIS — J4520 Mild intermittent asthma, uncomplicated: Secondary | ICD-10-CM | POA: Diagnosis not present

## 2018-07-07 DIAGNOSIS — R5383 Other fatigue: Secondary | ICD-10-CM | POA: Diagnosis not present

## 2018-07-07 DIAGNOSIS — E7800 Pure hypercholesterolemia, unspecified: Secondary | ICD-10-CM | POA: Diagnosis not present

## 2018-07-07 DIAGNOSIS — I499 Cardiac arrhythmia, unspecified: Secondary | ICD-10-CM | POA: Diagnosis not present

## 2018-07-07 DIAGNOSIS — Z0000 Encounter for general adult medical examination without abnormal findings: Secondary | ICD-10-CM | POA: Diagnosis not present

## 2018-07-07 DIAGNOSIS — Z1389 Encounter for screening for other disorder: Secondary | ICD-10-CM | POA: Diagnosis not present

## 2018-07-18 DIAGNOSIS — R300 Dysuria: Secondary | ICD-10-CM | POA: Diagnosis not present

## 2018-07-18 DIAGNOSIS — R5383 Other fatigue: Secondary | ICD-10-CM | POA: Diagnosis not present

## 2018-07-18 DIAGNOSIS — M791 Myalgia, unspecified site: Secondary | ICD-10-CM | POA: Diagnosis not present

## 2018-07-18 DIAGNOSIS — E7800 Pure hypercholesterolemia, unspecified: Secondary | ICD-10-CM | POA: Diagnosis not present

## 2018-07-18 DIAGNOSIS — T466X5A Adverse effect of antihyperlipidemic and antiarteriosclerotic drugs, initial encounter: Secondary | ICD-10-CM | POA: Diagnosis not present

## 2018-07-28 DIAGNOSIS — Z961 Presence of intraocular lens: Secondary | ICD-10-CM | POA: Diagnosis not present

## 2018-08-03 ENCOUNTER — Ambulatory Visit: Payer: Medicare Other

## 2018-08-17 ENCOUNTER — Ambulatory Visit
Admission: RE | Admit: 2018-08-17 | Discharge: 2018-08-17 | Disposition: A | Discharge status: Home / Self Care | Payer: Medicare Other | Source: Ambulatory Visit | Attending: Family Medicine | Admitting: Family Medicine

## 2018-08-17 DIAGNOSIS — Z1231 Encounter for screening mammogram for malignant neoplasm of breast: Secondary | ICD-10-CM | POA: Diagnosis not present

## 2018-10-04 DIAGNOSIS — Z23 Encounter for immunization: Secondary | ICD-10-CM | POA: Diagnosis not present

## 2018-10-17 DIAGNOSIS — J019 Acute sinusitis, unspecified: Secondary | ICD-10-CM | POA: Diagnosis not present

## 2018-10-17 DIAGNOSIS — R05 Cough: Secondary | ICD-10-CM | POA: Diagnosis not present

## 2019-01-03 DIAGNOSIS — R5383 Other fatigue: Secondary | ICD-10-CM | POA: Diagnosis not present

## 2019-01-03 DIAGNOSIS — R300 Dysuria: Secondary | ICD-10-CM | POA: Diagnosis not present

## 2019-01-03 DIAGNOSIS — E7800 Pure hypercholesterolemia, unspecified: Secondary | ICD-10-CM | POA: Diagnosis not present

## 2019-01-09 DIAGNOSIS — E7800 Pure hypercholesterolemia, unspecified: Secondary | ICD-10-CM | POA: Diagnosis not present

## 2019-03-05 IMAGING — MG DIGITAL SCREENING BILATERAL MAMMOGRAM WITH TOMO AND CAD
8 series · 9 of 24 positions shown · non-contrast
Comparison: Previous exam(s).

CLINICAL DATA: Screening.

EXAM:
DIGITAL SCREENING BILATERAL MAMMOGRAM WITH TOMO AND CAD

[L MLO synth-2D]
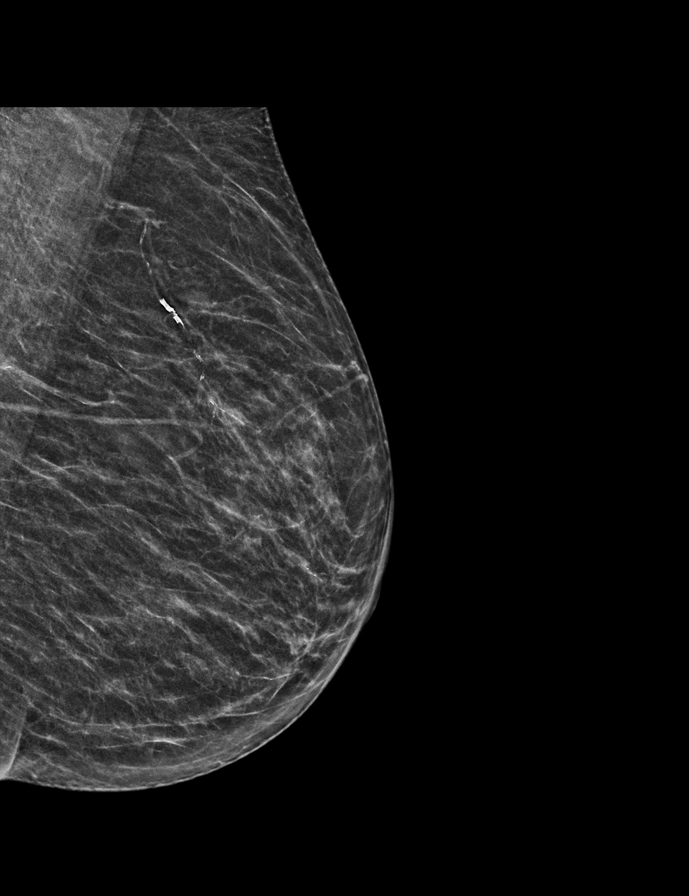

[R CC synth-2D]
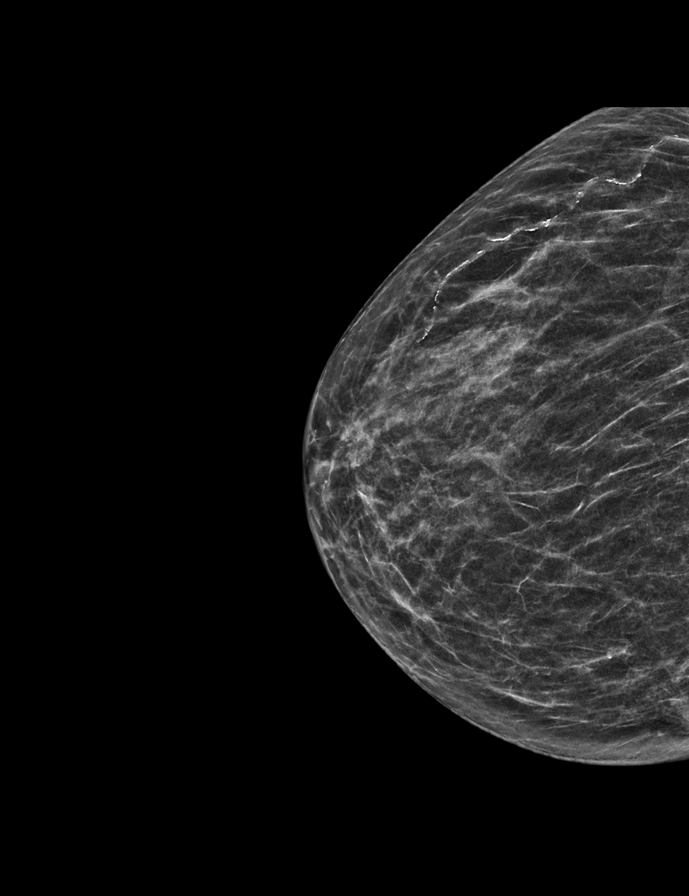

[L CC synth-2D]
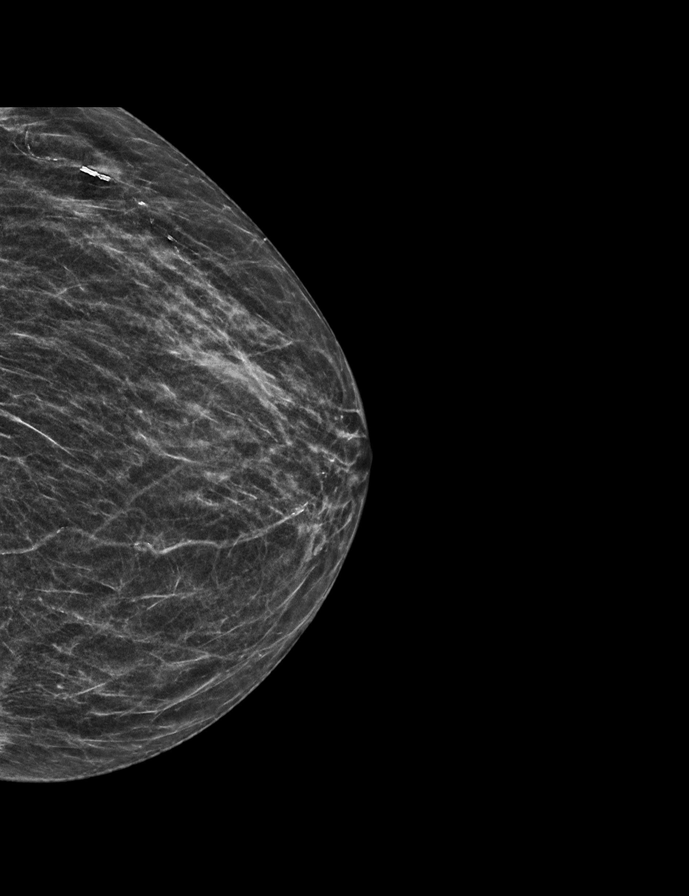

[R MLO synth-2D]
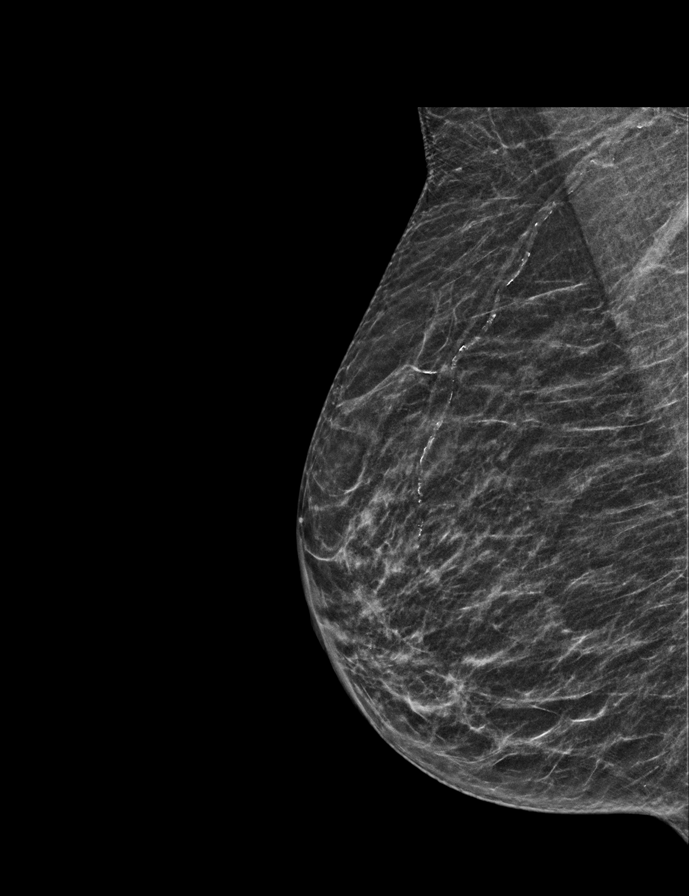

[R MLO tomo · 2 of 42 frames shown]
[frame 14/42]
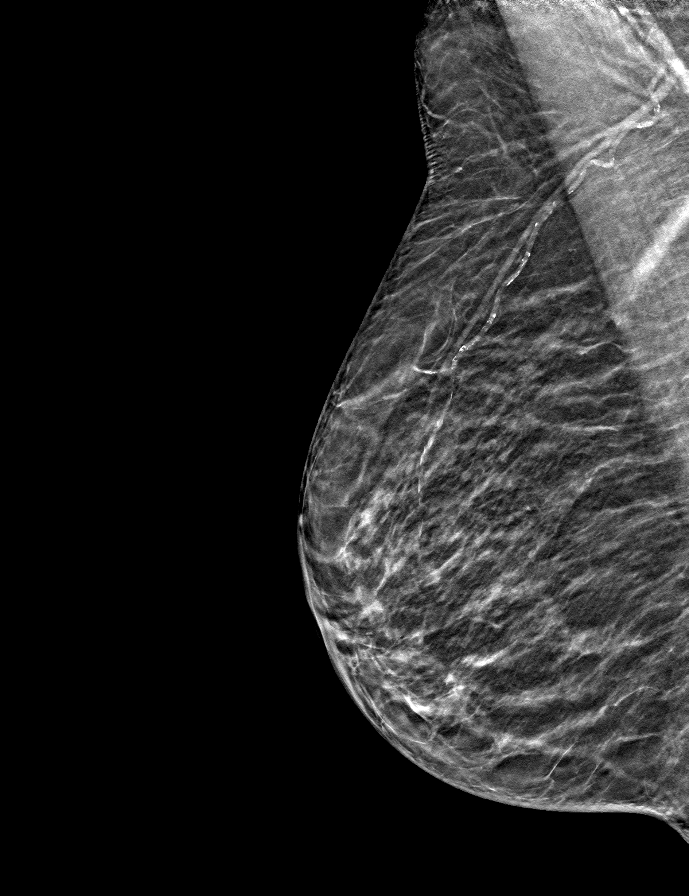
[frame 21/42]
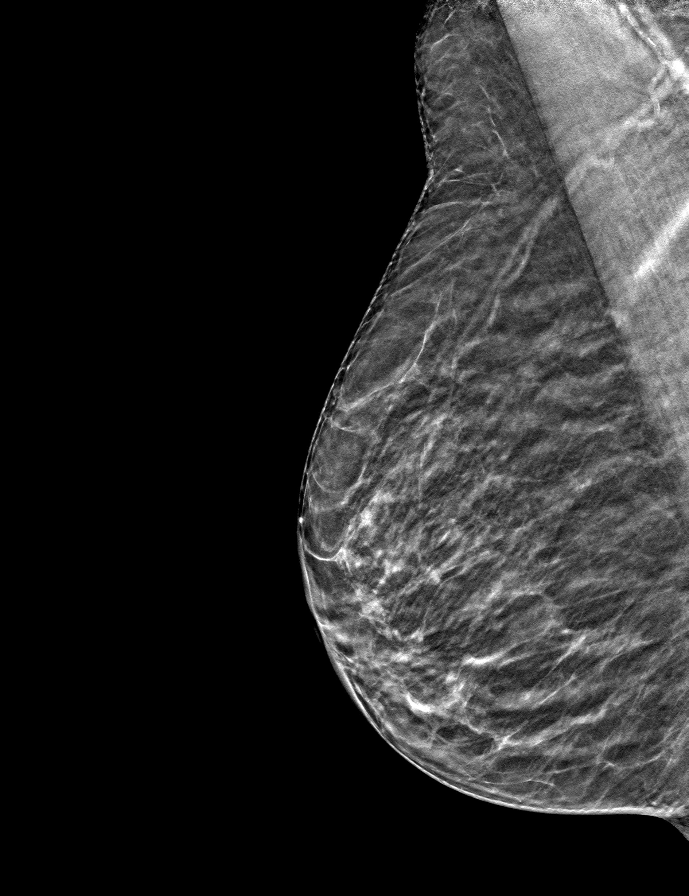

[L MLO tomo · tomo slice 21/40.0]
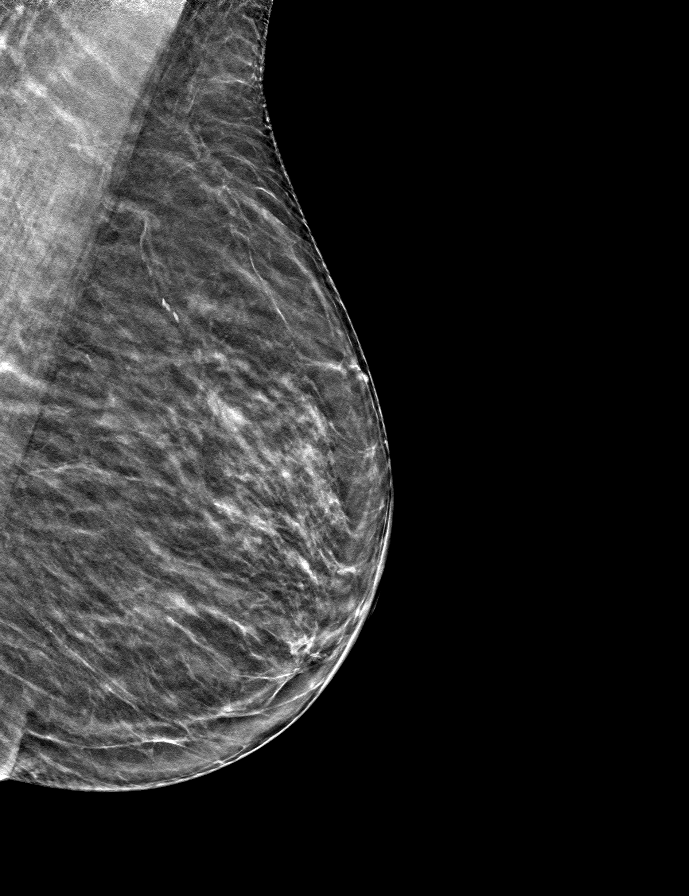

[R CC tomo · tomo slice 21/42.0]
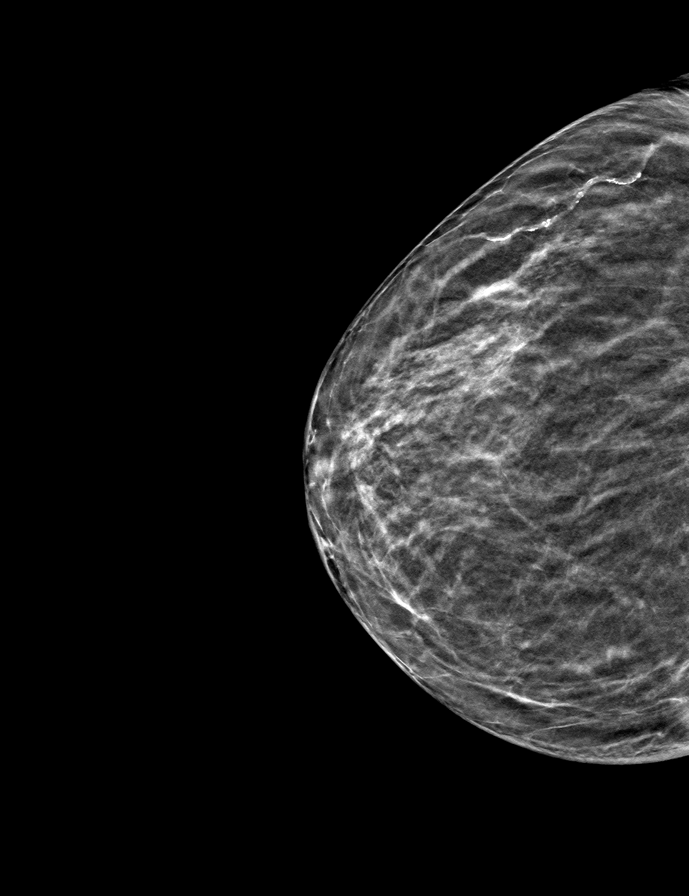

[L CC tomo · tomo slice 21/40.0]
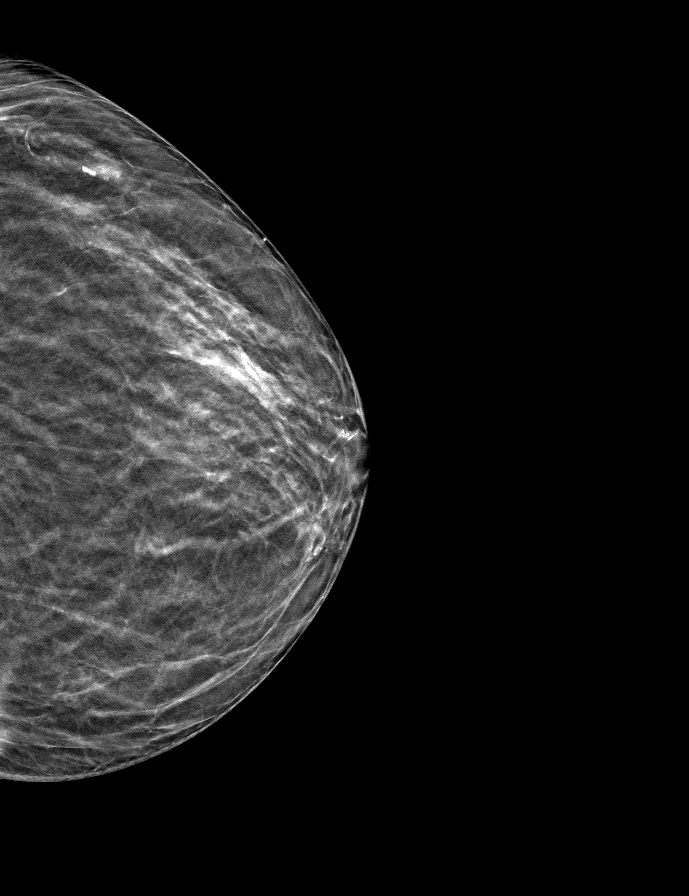

[9 of 24 positions shown; findings below may reference images not displayed]

ACR Breast Density Category b: There are scattered areas of
fibroglandular density.
FINDINGS: There are no findings suspicious for malignancy. Images were
processed with CAD.
IMPRESSION: No mammographic evidence of malignancy. A result letter of this
screening mammogram will be mailed directly to the patient.

RECOMMENDATION:
Screening mammogram in one year. (Code:[id number])

BI-RADS CATEGORY  1: Negative.

## 2019-05-11 DIAGNOSIS — Z681 Body mass index (BMI) 19 or less, adult: Secondary | ICD-10-CM | POA: Diagnosis not present

## 2019-05-11 DIAGNOSIS — R1033 Periumbilical pain: Secondary | ICD-10-CM | POA: Diagnosis not present

## 2019-05-11 DIAGNOSIS — R194 Change in bowel habit: Secondary | ICD-10-CM | POA: Diagnosis not present

## 2019-05-11 DIAGNOSIS — K219 Gastro-esophageal reflux disease without esophagitis: Secondary | ICD-10-CM | POA: Diagnosis not present

## 2019-05-18 DIAGNOSIS — R109 Unspecified abdominal pain: Secondary | ICD-10-CM | POA: Diagnosis not present

## 2019-05-23 ENCOUNTER — Other Ambulatory Visit: Payer: Self-pay | Admitting: Family Medicine

## 2019-05-23 DIAGNOSIS — R109 Unspecified abdominal pain: Secondary | ICD-10-CM

## 2019-06-06 ENCOUNTER — Ambulatory Visit
Admission: RE | Admit: 2019-06-06 | Discharge: 2019-06-06 | Disposition: A | Discharge status: Home / Self Care | Payer: Medicare Other | Source: Ambulatory Visit | Attending: Family Medicine | Admitting: Family Medicine

## 2019-06-06 ENCOUNTER — Other Ambulatory Visit: Payer: Self-pay

## 2019-06-06 DIAGNOSIS — N281 Cyst of kidney, acquired: Secondary | ICD-10-CM | POA: Diagnosis not present

## 2019-06-06 DIAGNOSIS — K7689 Other specified diseases of liver: Secondary | ICD-10-CM | POA: Diagnosis not present

## 2019-06-06 DIAGNOSIS — K824 Cholesterolosis of gallbladder: Secondary | ICD-10-CM | POA: Diagnosis not present

## 2019-06-06 DIAGNOSIS — R109 Unspecified abdominal pain: Secondary | ICD-10-CM

## 2019-06-28 DIAGNOSIS — R1314 Dysphagia, pharyngoesophageal phase: Secondary | ICD-10-CM | POA: Diagnosis not present

## 2019-06-28 DIAGNOSIS — K219 Gastro-esophageal reflux disease without esophagitis: Secondary | ICD-10-CM | POA: Diagnosis not present

## 2019-06-28 DIAGNOSIS — R1013 Epigastric pain: Secondary | ICD-10-CM | POA: Diagnosis not present

## 2019-06-29 ENCOUNTER — Other Ambulatory Visit: Payer: Self-pay | Admitting: Physician Assistant

## 2019-06-29 DIAGNOSIS — R1013 Epigastric pain: Secondary | ICD-10-CM

## 2019-06-29 DIAGNOSIS — R1314 Dysphagia, pharyngoesophageal phase: Secondary | ICD-10-CM

## 2019-06-30 DIAGNOSIS — R1013 Epigastric pain: Secondary | ICD-10-CM | POA: Diagnosis not present

## 2019-07-05 ENCOUNTER — Ambulatory Visit
Admission: RE | Admit: 2019-07-05 | Discharge: 2019-07-05 | Disposition: A | Discharge status: Home / Self Care | Payer: Medicare Other | Source: Ambulatory Visit | Attending: Physician Assistant | Admitting: Physician Assistant

## 2019-07-05 DIAGNOSIS — R1314 Dysphagia, pharyngoesophageal phase: Secondary | ICD-10-CM

## 2019-07-05 DIAGNOSIS — R1013 Epigastric pain: Secondary | ICD-10-CM

## 2019-07-05 DIAGNOSIS — K219 Gastro-esophageal reflux disease without esophagitis: Secondary | ICD-10-CM | POA: Diagnosis not present

## 2019-08-03 DIAGNOSIS — Z961 Presence of intraocular lens: Secondary | ICD-10-CM | POA: Diagnosis not present

## 2019-08-17 DIAGNOSIS — E7800 Pure hypercholesterolemia, unspecified: Secondary | ICD-10-CM | POA: Diagnosis not present

## 2019-08-17 DIAGNOSIS — Z0000 Encounter for general adult medical examination without abnormal findings: Secondary | ICD-10-CM | POA: Diagnosis not present

## 2019-09-01 DIAGNOSIS — Z0000 Encounter for general adult medical examination without abnormal findings: Secondary | ICD-10-CM | POA: Diagnosis not present

## 2019-09-01 DIAGNOSIS — Z23 Encounter for immunization: Secondary | ICD-10-CM | POA: Diagnosis not present

## 2019-09-01 DIAGNOSIS — E7800 Pure hypercholesterolemia, unspecified: Secondary | ICD-10-CM | POA: Diagnosis not present

## 2019-09-05 DIAGNOSIS — K219 Gastro-esophageal reflux disease without esophagitis: Secondary | ICD-10-CM | POA: Diagnosis not present

## 2019-12-12 DIAGNOSIS — K2930 Chronic superficial gastritis without bleeding: Secondary | ICD-10-CM | POA: Diagnosis not present

## 2019-12-28 ENCOUNTER — Other Ambulatory Visit: Payer: Self-pay | Admitting: Gastroenterology

## 2019-12-28 DIAGNOSIS — R1013 Epigastric pain: Secondary | ICD-10-CM

## 2020-01-09 ENCOUNTER — Other Ambulatory Visit: Payer: Self-pay

## 2020-01-09 ENCOUNTER — Ambulatory Visit
Admission: RE | Admit: 2020-01-09 | Discharge: 2020-01-09 | Disposition: A | Discharge status: Home / Self Care | Payer: Medicare Other | Source: Ambulatory Visit | Attending: Gastroenterology | Admitting: Gastroenterology

## 2020-01-09 DIAGNOSIS — R1013 Epigastric pain: Secondary | ICD-10-CM

## 2020-01-09 DIAGNOSIS — K7689 Other specified diseases of liver: Secondary | ICD-10-CM | POA: Diagnosis not present

## 2020-01-09 DIAGNOSIS — N281 Cyst of kidney, acquired: Secondary | ICD-10-CM | POA: Diagnosis not present

## 2020-01-09 MED ORDER — IOPAMIDOL (ISOVUE-300) INJECTION 61%
100.0000 mL | Freq: Once | INTRAVENOUS | Status: AC | PRN
  Administered 2020-01-09: 100 mL via INTRAVENOUS

## 2020-04-18 DIAGNOSIS — R0600 Dyspnea, unspecified: Secondary | ICD-10-CM | POA: Diagnosis not present

## 2020-05-07 ENCOUNTER — Encounter (INDEPENDENT_AMBULATORY_CARE_PROVIDER_SITE_OTHER): Payer: Self-pay

## 2020-05-07 ENCOUNTER — Other Ambulatory Visit: Payer: Self-pay

## 2020-05-07 ENCOUNTER — Encounter: Payer: Self-pay | Admitting: Cardiovascular Disease

## 2020-05-07 ENCOUNTER — Ambulatory Visit (INDEPENDENT_AMBULATORY_CARE_PROVIDER_SITE_OTHER): Payer: Medicare Other | Admitting: Cardiovascular Disease

## 2020-05-07 VITALS — BP 138/80 | HR 90 | Ht 60.0 in | Wt 105.0 lb

## 2020-05-07 DIAGNOSIS — R0600 Dyspnea, unspecified: Secondary | ICD-10-CM

## 2020-05-07 DIAGNOSIS — R0609 Other forms of dyspnea: Secondary | ICD-10-CM | POA: Insufficient documentation

## 2020-05-07 NOTE — Patient Instructions (Signed)
Medication Instructions:  Your physician recommends that you continue on your current medications as directed. Please refer to the Current Medication list given to you today.  *If you need a refill on your cardiac medications before your next appointment, please call your pharmacy*    Testing/Procedures: Your physician has requested that you have an echocardiogram. Echocardiography is a painless test that uses sound waves to create images of your heart. It provides your doctor with information about the size and shape of your heart and how well your heart's chambers and valves are working. This procedure takes approximately one hour. There are no restrictions for this procedure.    Follow-Up: At [facility], you and your health needs are our priority.  As part of our continuing mission to provide you with exceptional heart care, we have created designated Provider Care Teams.  These Care Teams include your primary Cardiologist (physician) and Advanced Practice Providers (APPs -  Physician Assistants and Nurse Practitioners) who all work together to provide you with the care you need, when you need it.  We recommend signing up for the patient portal called "MyChart".  Sign up information is provided on this After Visit Summary.  MyChart is used to connect with patients for Virtual Visits (Telemedicine).  Patients are able to view lab/test results, encounter notes, upcoming appointments, etc.  Non-urgent messages can be sent to your provider as well.   To learn more about what you can do with MyChart, go to [email or web address].    Your next appointment:   As needed  The format for your next appointment:   In Person  Provider:   Quay Burow, MD
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
## 2020-05-07 NOTE — Assessment & Plan Note (Signed)
Jessica Hull was referred to me by Dr. Harrington Challenger for dyspnea on exertion.  She does have a history of reactive airways disease in the past.  Over the last several months she is noticed some shortness of breath on exertion but denies chest pain.  We will get a 2D echo to further evaluate.

## 2020-05-27 ENCOUNTER — Ambulatory Visit (HOSPITAL_COMMUNITY): Payer: Medicare Other | Attending: Cardiovascular Disease

## 2020-05-27 ENCOUNTER — Other Ambulatory Visit: Payer: Self-pay

## 2020-05-27 DIAGNOSIS — R0600 Dyspnea, unspecified: Secondary | ICD-10-CM | POA: Diagnosis not present

## 2020-05-27 DIAGNOSIS — R0609 Other forms of dyspnea: Secondary | ICD-10-CM

## 2020-08-02 DIAGNOSIS — Z961 Presence of intraocular lens: Secondary | ICD-10-CM | POA: Diagnosis not present

## 2020-09-05 DIAGNOSIS — Z0000 Encounter for general adult medical examination without abnormal findings: Secondary | ICD-10-CM | POA: Diagnosis not present

## 2020-09-05 DIAGNOSIS — E7800 Pure hypercholesterolemia, unspecified: Secondary | ICD-10-CM | POA: Diagnosis not present

## 2020-09-05 DIAGNOSIS — Z131 Encounter for screening for diabetes mellitus: Secondary | ICD-10-CM | POA: Diagnosis not present

## 2020-09-05 DIAGNOSIS — Z23 Encounter for immunization: Secondary | ICD-10-CM | POA: Diagnosis not present

## 2020-09-05 DIAGNOSIS — J4520 Mild intermittent asthma, uncomplicated: Secondary | ICD-10-CM | POA: Diagnosis not present

## 2020-09-05 DIAGNOSIS — L309 Dermatitis, unspecified: Secondary | ICD-10-CM | POA: Diagnosis not present

## 2020-09-11 DIAGNOSIS — S8001XA Contusion of right knee, initial encounter: Secondary | ICD-10-CM | POA: Diagnosis not present

## 2020-09-11 DIAGNOSIS — M2560 Stiffness of unspecified joint, not elsewhere classified: Secondary | ICD-10-CM | POA: Diagnosis not present

## 2020-09-17 DIAGNOSIS — Z23 Encounter for immunization: Secondary | ICD-10-CM | POA: Diagnosis not present

## 2020-10-16 DIAGNOSIS — M79671 Pain in right foot: Secondary | ICD-10-CM | POA: Diagnosis not present

## 2020-10-16 DIAGNOSIS — R55 Syncope and collapse: Secondary | ICD-10-CM | POA: Diagnosis not present

## 2020-12-23 DIAGNOSIS — U071 COVID-19: Secondary | ICD-10-CM | POA: Diagnosis not present

## 2020-12-23 DIAGNOSIS — Z20822 Contact with and (suspected) exposure to covid-19: Secondary | ICD-10-CM | POA: Diagnosis not present

## 2021-03-25 DIAGNOSIS — Z23 Encounter for immunization: Secondary | ICD-10-CM | POA: Diagnosis not present

## 2021-04-08 DIAGNOSIS — J019 Acute sinusitis, unspecified: Secondary | ICD-10-CM | POA: Diagnosis not present

## 2021-04-15 DIAGNOSIS — R0981 Nasal congestion: Secondary | ICD-10-CM | POA: Diagnosis not present

## 2021-04-15 DIAGNOSIS — Z03818 Encounter for observation for suspected exposure to other biological agents ruled out: Secondary | ICD-10-CM | POA: Diagnosis not present

## 2021-04-15 DIAGNOSIS — R42 Dizziness and giddiness: Secondary | ICD-10-CM | POA: Diagnosis not present

## 2021-04-29 DIAGNOSIS — R030 Elevated blood-pressure reading, without diagnosis of hypertension: Secondary | ICD-10-CM | POA: Diagnosis not present

## 2021-04-29 DIAGNOSIS — J019 Acute sinusitis, unspecified: Secondary | ICD-10-CM | POA: Diagnosis not present

## 2021-05-14 DIAGNOSIS — J019 Acute sinusitis, unspecified: Secondary | ICD-10-CM | POA: Diagnosis not present

## 2021-05-14 DIAGNOSIS — R030 Elevated blood-pressure reading, without diagnosis of hypertension: Secondary | ICD-10-CM | POA: Diagnosis not present

## 2021-05-14 DIAGNOSIS — M25561 Pain in right knee: Secondary | ICD-10-CM | POA: Diagnosis not present

## 2021-05-27 DIAGNOSIS — R0789 Other chest pain: Secondary | ICD-10-CM | POA: Diagnosis not present

## 2021-06-03 ENCOUNTER — Telehealth: Payer: Self-pay | Admitting: Cardiovascular Disease

## 2021-06-03 NOTE — Telephone Encounter (Signed)
Patient call to get an earlier appt because when she got a ekg done it was out of rhythm. Patient states its tightness in her chest that a sharp pain towards her back. Please advise

## 2021-06-03 NOTE — Telephone Encounter (Signed)
Called pt regarding abnormal EKG and chest pressure and pain/tightness. Per pt was seen by Dr. Harrington Challenger at [facility], at this appointment Dr. Harrington Challenger told the pt that she has an abnormal EKG, she does state that the abnormal rhythm is not atrial fibrillation. Unable to see EKG or note from Dr. Harrington Challenger at this time. Pt states that she has recently started to feel chest pressure and pain in her back. Was able to set pt up with sooner appointment with Dr. Gwenlyn Found. Also, reviewed ED precautions with pt. Pt verbalizes understanding.

## 2021-06-24 ENCOUNTER — Other Ambulatory Visit: Payer: Self-pay

## 2021-06-24 ENCOUNTER — Encounter: Payer: Self-pay | Admitting: Cardiovascular Disease

## 2021-06-24 ENCOUNTER — Ambulatory Visit (INDEPENDENT_AMBULATORY_CARE_PROVIDER_SITE_OTHER): Payer: Medicare Other | Admitting: Cardiovascular Disease

## 2021-06-24 DIAGNOSIS — I10 Essential (primary) hypertension: Secondary | ICD-10-CM | POA: Diagnosis not present

## 2021-06-24 HISTORY — DX: Essential (primary) hypertension: I10

## 2021-06-24 NOTE — Patient Instructions (Signed)
Medication Instructions:  No Changes In Medications at this time.  *If you need a refill on your cardiac medications before your next appointment, please call your pharmacy*  Follow-Up: At [facility], you and your health needs are our priority.  As part of our continuing mission to provide you with exceptional heart care, we have created designated Provider Care Teams.  These Care Teams include your primary Cardiologist (physician) and Advanced Practice Providers (APPs -  Physician Assistants and Nurse Practitioners) who all work together to provide you with the care you need, when you need it.  We recommend signing up for the patient portal called "MyChart".  Sign up information is provided on this After Visit Summary.  MyChart is used to connect with patients for Virtual Visits (Telemedicine).  Patients are able to view lab/test results, encounter notes, upcoming appointments, etc.  Non-urgent messages can be sent to your provider as well.   To learn more about what you can do with MyChart, go to [email or web address].    Your next appointment:   1 year(s)  The format for your next appointment:   In Person  Provider:   Jonathan Berry, MD 
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
## 2021-06-24 NOTE — Assessment & Plan Note (Signed)
Recent history of essential hypertension blood pressure measured today at 162/82.  She did keep a blood pressure log which she showed to me today with blood pressures in the 1 20-1 30 range over 60-70.  She was started on low-dose beta-blocker.

## 2021-06-27 NOTE — Addendum Note (Signed)
Addended by: Jacqulynn Cadet on: 06/27/2021 10:00 AM   Modules accepted: Orders

## 2021-08-04 ENCOUNTER — Ambulatory Visit: Payer: Medicare Other | Admitting: Physician Assistant

## 2021-08-04 DIAGNOSIS — Z961 Presence of intraocular lens: Secondary | ICD-10-CM | POA: Diagnosis not present

## 2021-08-19 DIAGNOSIS — Q179 Congenital malformation of ear, unspecified: Secondary | ICD-10-CM | POA: Diagnosis not present

## 2021-08-19 DIAGNOSIS — R030 Elevated blood-pressure reading, without diagnosis of hypertension: Secondary | ICD-10-CM | POA: Diagnosis not present

## 2021-09-11 DIAGNOSIS — H9222 Otorrhagia, left ear: Secondary | ICD-10-CM | POA: Diagnosis not present

## 2021-09-18 DIAGNOSIS — Z131 Encounter for screening for diabetes mellitus: Secondary | ICD-10-CM | POA: Diagnosis not present

## 2021-09-18 DIAGNOSIS — E7800 Pure hypercholesterolemia, unspecified: Secondary | ICD-10-CM | POA: Diagnosis not present

## 2021-09-24 DIAGNOSIS — Z23 Encounter for immunization: Secondary | ICD-10-CM | POA: Diagnosis not present

## 2021-09-25 DIAGNOSIS — I499 Cardiac arrhythmia, unspecified: Secondary | ICD-10-CM | POA: Diagnosis not present

## 2021-09-25 DIAGNOSIS — J4520 Mild intermittent asthma, uncomplicated: Secondary | ICD-10-CM | POA: Diagnosis not present

## 2021-09-25 DIAGNOSIS — H9190 Unspecified hearing loss, unspecified ear: Secondary | ICD-10-CM | POA: Diagnosis not present

## 2021-09-25 DIAGNOSIS — Z79899 Other long term (current) drug therapy: Secondary | ICD-10-CM | POA: Diagnosis not present

## 2021-09-25 DIAGNOSIS — Z0000 Encounter for general adult medical examination without abnormal findings: Secondary | ICD-10-CM | POA: Diagnosis not present

## 2021-09-25 DIAGNOSIS — R7301 Impaired fasting glucose: Secondary | ICD-10-CM | POA: Diagnosis not present

## 2021-09-25 DIAGNOSIS — E7800 Pure hypercholesterolemia, unspecified: Secondary | ICD-10-CM | POA: Diagnosis not present

## 2021-10-08 DIAGNOSIS — R21 Rash and other nonspecific skin eruption: Secondary | ICD-10-CM | POA: Diagnosis not present

## 2021-10-08 DIAGNOSIS — R030 Elevated blood-pressure reading, without diagnosis of hypertension: Secondary | ICD-10-CM | POA: Diagnosis not present

## 2021-10-08 DIAGNOSIS — L299 Pruritus, unspecified: Secondary | ICD-10-CM | POA: Diagnosis not present

## 2022-02-09 DIAGNOSIS — R109 Unspecified abdominal pain: Secondary | ICD-10-CM | POA: Diagnosis not present

## 2022-02-09 DIAGNOSIS — M545 Low back pain, unspecified: Secondary | ICD-10-CM | POA: Diagnosis not present

## 2022-02-09 DIAGNOSIS — K219 Gastro-esophageal reflux disease without esophagitis: Secondary | ICD-10-CM | POA: Diagnosis not present

## 2022-02-10 ENCOUNTER — Other Ambulatory Visit: Payer: Self-pay | Admitting: Family Medicine

## 2022-02-10 DIAGNOSIS — M549 Dorsalgia, unspecified: Secondary | ICD-10-CM

## 2022-02-10 DIAGNOSIS — R109 Unspecified abdominal pain: Secondary | ICD-10-CM

## 2022-02-23 ENCOUNTER — Ambulatory Visit
Admission: RE | Admit: 2022-02-23 | Discharge: 2022-02-23 | Disposition: A | Discharge status: Home / Self Care | Payer: Medicare Other | Source: Ambulatory Visit | Attending: Family Medicine | Admitting: Family Medicine

## 2022-02-23 ENCOUNTER — Other Ambulatory Visit: Payer: Self-pay | Admitting: Family Medicine

## 2022-02-23 DIAGNOSIS — N281 Cyst of kidney, acquired: Secondary | ICD-10-CM | POA: Diagnosis not present

## 2022-02-23 DIAGNOSIS — M47816 Spondylosis without myelopathy or radiculopathy, lumbar region: Secondary | ICD-10-CM | POA: Diagnosis not present

## 2022-02-23 DIAGNOSIS — M545 Low back pain, unspecified: Secondary | ICD-10-CM

## 2022-02-23 DIAGNOSIS — M549 Dorsalgia, unspecified: Secondary | ICD-10-CM

## 2022-02-23 DIAGNOSIS — R109 Unspecified abdominal pain: Secondary | ICD-10-CM

## 2022-02-23 DIAGNOSIS — K7689 Other specified diseases of liver: Secondary | ICD-10-CM | POA: Diagnosis not present

## 2022-02-24 DIAGNOSIS — M545 Low back pain, unspecified: Secondary | ICD-10-CM | POA: Diagnosis not present

## 2022-03-09 DIAGNOSIS — K30 Functional dyspepsia: Secondary | ICD-10-CM | POA: Diagnosis not present

## 2022-03-09 DIAGNOSIS — M5136 Other intervertebral disc degeneration, lumbar region: Secondary | ICD-10-CM | POA: Diagnosis not present

## 2022-03-12 DIAGNOSIS — M5459 Other low back pain: Secondary | ICD-10-CM | POA: Diagnosis not present

## 2022-03-17 ENCOUNTER — Ambulatory Visit: Payer: Self-pay | Admitting: Surgery

## 2022-03-17 DIAGNOSIS — K8020 Calculus of gallbladder without cholecystitis without obstruction: Secondary | ICD-10-CM

## 2022-03-17 DIAGNOSIS — K8010 Calculus of gallbladder with chronic cholecystitis without obstruction: Secondary | ICD-10-CM | POA: Diagnosis not present

## 2022-04-07 NOTE — Pre-Procedure Instructions (Signed)
Surgical Instructions ? ? ? Your procedure is scheduled on Thursday 04/16/22. ? ? Report to [facility] Main Entrance "A" at 05:30 A.M., then check in with the Admitting office. ? Call this number if you have problems the morning of surgery: ? [phone] ? ? If you have any questions prior to your surgery date call [phone]: Open Monday-Friday 8am-4pm ? ? ? Remember: ? Do not eat after midnight the night before your surgery ? ?You may drink clear liquids until 04:30 A.M. the morning of your surgery.   ?Clear liquids allowed are: Water, Non-Citrus Juices (without pulp), Carbonated Beverages, Clear Tea, Black Coffee ONLY (NO MILK, CREAM OR POWDERED CREAMER of any kind), and Gatorade ?  ? Take these medicines the morning of surgery with A SIP OF WATER:  ? ezetimibe (ZETIA) ? metoprolol tartrate (LOPRESSOR) ? ? rosuvastatin (CRESTOR)- If it is your normal day to take this medication.  ? ? ?As of today, STOP taking any Aspirin (unless otherwise instructed by your surgeon) Aleve, Naproxen, Ibuprofen, Motrin, Advil, Goody's, BC's, all herbal medications, fish oil, and all vitamins. ? ?         ?Do not wear jewelry or makeup ?Do not wear lotions, powders, perfumes/colognes, or deodorant. ?Do not shave 48 hours prior to surgery.  Men may shave face and neck. ?Do not bring valuables to the hospital. ?Do not wear nail polish, gel polish, artificial nails, or any other type of covering on natural nails (fingers and toes) ?If you have artificial nails or gel coating that need to be removed by a nail salon, please have this removed prior to surgery. Artificial nails or gel coating may interfere with anesthesia's ability to adequately monitor your vital signs. ? ?[facility] is not responsible for any belongings or valuables. .  ? ?Do NOT Smoke (Tobacco/Vaping)  24 hours prior to your procedure ? ?If you use a CPAP at night, you may bring your mask for your overnight stay. ?  ?Contacts, glasses, hearing aids, dentures or  partials may not be worn into surgery, please bring cases for these belongings ?  ?For patients admitted to the hospital, discharge time will be determined by your treatment team. ?  ?Patients discharged the day of surgery will not be allowed to drive home, and someone needs to stay with them for 24 hours. ? ? ?SURGICAL WAITING ROOM VISITATION ?Patients having surgery or a procedure in a hospital may have two support people. ?Children under the age of 23 must have an adult with them who is not the patient. ?They may stay in the waiting area during the procedure and may switch out with other visitors. If the patient needs to stay at the hospital during part of their recovery, the visitor guidelines for inpatient rooms apply. ? ?Please refer to the [facility] website for the visitor guidelines for Inpatients (after your surgery is over and you are in a regular room).  ? ? ? ? ? ?Special instructions:   ? ?Oral Hygiene is also important to reduce your risk of infection.  Remember - BRUSH YOUR TEETH THE MORNING OF SURGERY WITH YOUR REGULAR TOOTHPASTE ? ? ?[facility]- Preparing For Surgery ? ?Before surgery, you can play an important role. Because skin is not sterile, your skin needs to be as free of germs as possible. You can reduce the number of germs on your skin by washing with CHG (chlorahexidine gluconate) Soap before surgery.  CHG is an antiseptic cleaner which kills germs and bonds with  the skin to continue killing germs even after washing.   ? ? ?Please do not use if you have an allergy to CHG or antibacterial soaps. If your skin becomes reddened/irritated stop using the CHG.  ?Do not shave (including legs and underarms) for at least 48 hours prior to first CHG shower. It is OK to shave your face. ? ?Please follow these instructions carefully. ?  ? ? Shower the NIGHT BEFORE SURGERY and the MORNING OF SURGERY with CHG Soap.  ? If you chose to wash your hair, wash your hair first as usual with your normal  shampoo. After you shampoo, rinse your hair and body thoroughly to remove the shampoo.  Then [employer] and genitals (private parts) with your normal soap and rinse thoroughly to remove soap. ? ?After that Use CHG Soap as you would any other liquid soap. You can apply CHG directly to the skin and wash gently with a scrungie or a clean washcloth.  ? ?Apply the CHG Soap to your body ONLY FROM THE NECK DOWN.  Do not use on open wounds or open sores. Avoid contact with your eyes, ears, mouth and genitals (private parts). Wash Face and genitals (private parts)  with your normal soap.  ? ?Wash thoroughly, paying special attention to the area where your surgery will be performed. ? ?Thoroughly rinse your body with warm water from the neck down. ? ?DO NOT shower/wash with your normal soap after using and rinsing off the CHG Soap. ? ?Pat yourself dry with a CLEAN TOWEL. ? ?Wear CLEAN PAJAMAS to bed the night before surgery ? ?Place CLEAN SHEETS on your bed the night before your surgery ? ?DO NOT SLEEP WITH PETS. ? ? ?Day of Surgery: ? ?Take a shower with CHG soap. ?Wear Clean/Comfortable clothing the morning of surgery ?Do not apply any deodorants/lotions.   ?Remember to brush your teeth WITH YOUR REGULAR TOOTHPASTE. ? ? ? ?If you received a COVID test during your pre-op visit  it is requested that you wear a mask when out in public, stay away from anyone that may not be feeling well and notify your surgeon if you develop symptoms. If you have been in contact with anyone that has tested positive in the last 10 days please notify you surgeon. ? ?  ?Please read over the following fact sheets that you were given.  ? ?

## 2022-04-08 ENCOUNTER — Encounter (HOSPITAL_COMMUNITY): Payer: Self-pay

## 2022-04-08 ENCOUNTER — Other Ambulatory Visit: Payer: Self-pay

## 2022-04-08 ENCOUNTER — Encounter (HOSPITAL_COMMUNITY)
Admission: RE | Admit: 2022-04-08 | Discharge: 2022-04-08 | Disposition: A | Discharge status: Home / Self Care | Payer: Medicare Other | Source: Ambulatory Visit | Attending: Surgery | Admitting: Surgery

## 2022-04-08 DIAGNOSIS — Z01812 Encounter for preprocedural laboratory examination: Secondary | ICD-10-CM | POA: Insufficient documentation

## 2022-04-08 DIAGNOSIS — K8020 Calculus of gallbladder without cholecystitis without obstruction: Secondary | ICD-10-CM | POA: Insufficient documentation

## 2022-04-08 LAB — CBC WITH DIFFERENTIAL/PLATELET
Abs Immature Granulocytes: 0.01 10*3/uL (ref 0.00–0.07)
Basophils Absolute: 0 10*3/uL (ref 0.0–0.1)
Basophils Relative: 1 %
Eosinophils Absolute: 0.2 10*3/uL (ref 0.0–0.5)
Eosinophils Relative: 2 %
HCT: 46.2 % — ABNORMAL HIGH (ref 36.0–46.0)
Hemoglobin: 15.1 g/dL — ABNORMAL HIGH (ref 12.0–15.0)
Immature Granulocytes: 0 %
Lymphocytes Relative: 22 %
Lymphs Abs: 1.6 10*3/uL (ref 0.7–4.0)
MCH: 30.3 pg (ref 26.0–34.0)
MCHC: 32.7 g/dL (ref 30.0–36.0)
MCV: 92.8 fL (ref 80.0–100.0)
Monocytes Absolute: 0.4 10*3/uL (ref 0.1–1.0)
Monocytes Relative: 5 %
Neutro Abs: 5 10*3/uL (ref 1.7–7.7)
Neutrophils Relative %: 70 %
Platelets: 255 10*3/uL (ref 150–400)
RBC: 4.98 MIL/uL (ref 3.87–5.11)
RDW: 12.8 % (ref 11.5–15.5)
WBC: 7.2 10*3/uL (ref 4.0–10.5)
nRBC: 0 % (ref 0.0–0.2)

## 2022-04-08 LAB — COMPREHENSIVE METABOLIC PANEL
ALT: 17 U/L (ref 0–44)
AST: 26 U/L (ref 15–41)
Albumin: 3.7 g/dL (ref 3.5–5.0)
Alkaline Phosphatase: 67 U/L (ref 38–126)
Anion gap: 7 (ref 5–15)
BUN: 10 mg/dL (ref 8–23)
CO2: 29 mmol/L (ref 22–32)
Calcium: 9.2 mg/dL (ref 8.9–10.3)
Chloride: 108 mmol/L (ref 98–111)
Creatinine, Ser: 0.85 mg/dL (ref 0.44–1.00)
GFR, Estimated: >60 mL/min (ref >60)
Glucose, Bld: 104 mg/dL — ABNORMAL HIGH (ref 70–99)
Potassium: 4.2 mmol/L (ref 3.5–5.1)
Sodium: 144 mmol/L (ref 135–145)
Total Bilirubin: 0.7 mg/dL (ref 0.3–1.2)
Total Protein: 6.5 g/dL (ref 6.5–8.1)

## 2022-04-08 NOTE — Progress Notes (Addendum)
PCP - Lawerance Cruel MD ?Cardiologist - Sometimes will see Dr. Gwenlyn Found for her afib ? ?Chest x-ray - Not indicated ?EKG - 06/24/21 ?Stress Test - "Yes I did, that's been a while ago though" ?ECHO - 05/27/20 ?Cardiac Cath - Denies ? ?Sleep Study - Denies ? ?DM - Denies ? ?ERAS Protcol -Yes ? ?Anesthesia review: no ? ? ?Patient denies shortness of breath, fever, cough and chest pain at PAT appointment ? ? ?All instructions explained to the patient, with a verbal understanding of the material. Patient agrees to go over the instructions while at home for a better understanding. The opportunity to ask questions was provided. ? ? ?

## 2022-04-16 ENCOUNTER — Encounter (HOSPITAL_COMMUNITY)
Admission: RE | Disposition: A | Discharge status: Home / Self Care | Payer: Self-pay | Source: Home / Self Care | Attending: Surgery

## 2022-04-16 ENCOUNTER — Other Ambulatory Visit: Payer: Self-pay

## 2022-04-16 ENCOUNTER — Ambulatory Visit (HOSPITAL_BASED_OUTPATIENT_CLINIC_OR_DEPARTMENT_OTHER): Payer: Medicare Other | Admitting: Anesthesiology

## 2022-04-16 ENCOUNTER — Ambulatory Visit (HOSPITAL_COMMUNITY)
Admission: RE | Admit: 2022-04-16 | Discharge: 2022-04-16 | Disposition: A | Discharge status: Home / Self Care | Payer: Medicare Other | Attending: Surgery | Admitting: Surgery

## 2022-04-16 ENCOUNTER — Ambulatory Visit (HOSPITAL_COMMUNITY): Payer: Medicare Other

## 2022-04-16 ENCOUNTER — Ambulatory Visit (HOSPITAL_COMMUNITY): Payer: Medicare Other | Admitting: Anesthesiology

## 2022-04-16 ENCOUNTER — Encounter (HOSPITAL_COMMUNITY): Payer: Self-pay | Admitting: Surgery

## 2022-04-16 DIAGNOSIS — K811 Chronic cholecystitis: Secondary | ICD-10-CM | POA: Diagnosis not present

## 2022-04-16 DIAGNOSIS — K8012 Calculus of gallbladder with acute and chronic cholecystitis without obstruction: Secondary | ICD-10-CM | POA: Diagnosis not present

## 2022-04-16 DIAGNOSIS — K8010 Calculus of gallbladder with chronic cholecystitis without obstruction: Secondary | ICD-10-CM | POA: Diagnosis not present

## 2022-04-16 DIAGNOSIS — K8020 Calculus of gallbladder without cholecystitis without obstruction: Secondary | ICD-10-CM

## 2022-04-16 DIAGNOSIS — I10 Essential (primary) hypertension: Secondary | ICD-10-CM | POA: Diagnosis not present

## 2022-04-16 HISTORY — PX: CHOLECYSTECTOMY: SHX55

## 2022-04-16 SURGERY — LAPAROSCOPIC CHOLECYSTECTOMY
Anesthesia: General

## 2022-04-16 MED ORDER — HEMOSTATIC AGENTS (NO CHARGE) OPTIME
TOPICAL | Status: DC | PRN
  Administered 2022-04-16: 1 via TOPICAL

## 2022-04-16 MED ORDER — OXYCODONE HCL 5 MG PO TABS
ORAL_TABLET | ORAL | Status: AC
  Filled 2022-04-16: qty 1

## 2022-04-16 MED ORDER — DEXAMETHASONE SODIUM PHOSPHATE 10 MG/ML IJ SOLN
INTRAMUSCULAR | Status: AC
  Filled 2022-04-16: qty 1

## 2022-04-16 MED ORDER — BUPIVACAINE-EPINEPHRINE 0.25% -1:200000 IJ SOLN
INTRAMUSCULAR | Status: DC | PRN
  Administered 2022-04-16: 12 mL

## 2022-04-16 MED ORDER — ONDANSETRON HCL 4 MG/2ML IJ SOLN
INTRAMUSCULAR | Status: DC | PRN
  Administered 2022-04-16: 4 mg via INTRAVENOUS

## 2022-04-16 MED ORDER — LIDOCAINE 2% (20 MG/ML) 5 ML SYRINGE
INTRAMUSCULAR | Status: AC
  Filled 2022-04-16: qty 5

## 2022-04-16 MED ORDER — 0.9 % SODIUM CHLORIDE (POUR BTL) OPTIME
TOPICAL | Status: DC | PRN
  Administered 2022-04-16: 1000 mL

## 2022-04-16 MED ORDER — OXYCODONE HCL 5 MG PO TABS: 5.0000 mg | ORAL_TABLET | Freq: Four times a day (QID) | ORAL | 0 refills | Status: AC | PRN

## 2022-04-16 MED ORDER — OXYCODONE HCL 5 MG PO TABS
5.0000 mg | ORAL_TABLET | Freq: Once | ORAL | Status: AC | PRN
  Administered 2022-04-16: 5 mg via ORAL

## 2022-04-16 MED ORDER — ONDANSETRON HCL 4 MG/2ML IJ SOLN
INTRAMUSCULAR | Status: AC
  Filled 2022-04-16: qty 2

## 2022-04-16 MED ORDER — ROCURONIUM BROMIDE 10 MG/ML (PF) SYRINGE
PREFILLED_SYRINGE | INTRAVENOUS | Status: DC | PRN
  Administered 2022-04-16: 50 mg via INTRAVENOUS

## 2022-04-16 MED ORDER — OXYCODONE HCL 5 MG/5ML PO SOLN: 5.0000 mg | Freq: Once | ORAL | Status: AC | PRN

## 2022-04-16 MED ORDER — PROPOFOL 10 MG/ML IV BOLUS
INTRAVENOUS | Status: DC | PRN
  Administered 2022-04-16: 150 mg via INTRAVENOUS

## 2022-04-16 MED ORDER — INDOCYANINE GREEN 25 MG IV SOLR
7.5000 mg | Freq: Once | INTRAVENOUS | Status: DC
  Filled 2022-04-16: qty 10

## 2022-04-16 MED ORDER — FENTANYL CITRATE (PF) 250 MCG/5ML IJ SOLN
INTRAMUSCULAR | Status: AC
  Filled 2022-04-16: qty 5

## 2022-04-16 MED ORDER — CHLORHEXIDINE GLUCONATE 0.12 % MT SOLN
OROMUCOSAL | Status: AC
  Administered 2022-04-16: 15 mL
  Filled 2022-04-16: qty 15

## 2022-04-16 MED ORDER — CLINDAMYCIN PHOSPHATE 900 MG/50ML IV SOLN
900.0000 mg | INTRAVENOUS | Status: AC
  Administered 2022-04-16: 900 mg via INTRAVENOUS
  Filled 2022-04-16: qty 50

## 2022-04-16 MED ORDER — PROMETHAZINE HCL 25 MG/ML IJ SOLN: 6.2500 mg | INTRAMUSCULAR | Status: DC | PRN

## 2022-04-16 MED ORDER — LIDOCAINE 2% (20 MG/ML) 5 ML SYRINGE
INTRAMUSCULAR | Status: DC | PRN
  Administered 2022-04-16: 60 mg via INTRAVENOUS

## 2022-04-16 MED ORDER — EPHEDRINE 5 MG/ML INJ
INTRAVENOUS | Status: AC
  Filled 2022-04-16: qty 5

## 2022-04-16 MED ORDER — HYDROMORPHONE HCL 1 MG/ML IJ SOLN
0.2500 mg | INTRAMUSCULAR | Status: DC | PRN
  Administered 2022-04-16: 0.5 mg via INTRAVENOUS

## 2022-04-16 MED ORDER — SODIUM CHLORIDE 0.9 % IR SOLN
Status: DC | PRN
  Administered 2022-04-16: 1000 mL

## 2022-04-16 MED ORDER — ROCURONIUM BROMIDE 10 MG/ML (PF) SYRINGE
PREFILLED_SYRINGE | INTRAVENOUS | Status: AC
  Filled 2022-04-16: qty 10

## 2022-04-16 MED ORDER — PHENYLEPHRINE 80 MCG/ML (10ML) SYRINGE FOR IV PUSH (FOR BLOOD PRESSURE SUPPORT)
PREFILLED_SYRINGE | INTRAVENOUS | Status: AC
  Filled 2022-04-16: qty 10

## 2022-04-16 MED ORDER — PROPOFOL 10 MG/ML IV BOLUS
INTRAVENOUS | Status: AC
  Filled 2022-04-16: qty 20

## 2022-04-16 MED ORDER — CHLORHEXIDINE GLUCONATE CLOTH 2 % EX PADS: 6.0000 | MEDICATED_PAD | Freq: Once | CUTANEOUS | Status: DC

## 2022-04-16 MED ORDER — SUGAMMADEX SODIUM 200 MG/2ML IV SOLN
INTRAVENOUS | Status: DC | PRN
  Administered 2022-04-16: 200 mg via INTRAVENOUS

## 2022-04-16 MED ORDER — BUPIVACAINE-EPINEPHRINE (PF) 0.25% -1:200000 IJ SOLN
INTRAMUSCULAR | Status: AC
  Filled 2022-04-16: qty 30

## 2022-04-16 MED ORDER — AMISULPRIDE (ANTIEMETIC) 5 MG/2ML IV SOLN
INTRAVENOUS | Status: DC
Start: 2022-04-16 — End: 2022-04-16
  Filled 2022-04-16: qty 4

## 2022-04-16 MED ORDER — MIDAZOLAM HCL 2 MG/2ML IJ SOLN
INTRAMUSCULAR | Status: AC
  Filled 2022-04-16: qty 2

## 2022-04-16 MED ORDER — FENTANYL CITRATE (PF) 250 MCG/5ML IJ SOLN
INTRAMUSCULAR | Status: DC | PRN
  Administered 2022-04-16: 100 ug via INTRAVENOUS

## 2022-04-16 MED ORDER — HYDROMORPHONE HCL 1 MG/ML IJ SOLN
INTRAMUSCULAR | Status: AC
  Filled 2022-04-16: qty 1

## 2022-04-16 MED ORDER — PHENYLEPHRINE 80 MCG/ML (10ML) SYRINGE FOR IV PUSH (FOR BLOOD PRESSURE SUPPORT)
PREFILLED_SYRINGE | INTRAVENOUS | Status: DC | PRN
  Administered 2022-04-16 (×4): 80 ug via INTRAVENOUS

## 2022-04-16 MED ORDER — AMISULPRIDE (ANTIEMETIC) 5 MG/2ML IV SOLN
10.0000 mg | Freq: Once | INTRAVENOUS | Status: AC | PRN
  Administered 2022-04-16: 10 mg via INTRAVENOUS

## 2022-04-16 MED ORDER — ACETAMINOPHEN 500 MG PO TABS
1000.0000 mg | ORAL_TABLET | ORAL | Status: AC
  Administered 2022-04-16: 1000 mg via ORAL
  Filled 2022-04-16: qty 2

## 2022-04-16 MED ORDER — DEXAMETHASONE SODIUM PHOSPHATE 10 MG/ML IJ SOLN
INTRAMUSCULAR | Status: DC | PRN
  Administered 2022-04-16: 10 mg via INTRAVENOUS

## 2022-04-16 MED ORDER — EPHEDRINE SULFATE-NACL 50-0.9 MG/10ML-% IV SOSY
PREFILLED_SYRINGE | INTRAVENOUS | Status: DC | PRN
  Administered 2022-04-16: 5 mg via INTRAVENOUS
  Administered 2022-04-16: 10 mg via INTRAVENOUS
  Administered 2022-04-16 (×2): 5 mg via INTRAVENOUS

## 2022-04-16 MED ORDER — LACTATED RINGERS IV SOLN: INTRAVENOUS | Status: DC | PRN

## 2022-04-16 MED ORDER — MEPERIDINE HCL 25 MG/ML IJ SOLN: 6.2500 mg | INTRAMUSCULAR | Status: DC | PRN

## 2022-04-16 SURGICAL SUPPLY — 45 items
ADH SKN CLS APL DERMABOND .7 (GAUZE/BANDAGES/DRESSINGS) ×1
APL PRP STRL LF DISP 70% ISPRP (MISCELLANEOUS) ×1
APL SRG 38 LTWT LNG FL B (MISCELLANEOUS) ×1
APPLICATOR ARISTA FLEXITIP XL (MISCELLANEOUS) ×1 IMPLANT
APPLIER CLIP ROT 10 11.4 M/L (STAPLE) ×2
APR CLP MED LRG 11.4X10 (STAPLE) ×1
BAG COUNTER SPONGE SURGICOUNT (BAG) ×2 IMPLANT
BAG SPEC RTRVL 10 TROC 200 (ENDOMECHANICALS) ×1
BAG SPNG CNTER NS LX DISP (BAG) ×1
CANISTER SUCT 3000ML PPV (MISCELLANEOUS) ×2 IMPLANT
CHLORAPREP W/TINT 26 (MISCELLANEOUS) ×2 IMPLANT
CLIP APPLIE ROT 10 11.4 M/L (STAPLE) ×1 IMPLANT
COVER SURGICAL LIGHT HANDLE (MISCELLANEOUS) ×2 IMPLANT
DERMABOND ADVANCED (GAUZE/BANDAGES/DRESSINGS) ×1
DERMABOND ADVANCED .7 DNX12 (GAUZE/BANDAGES/DRESSINGS) ×1 IMPLANT
ELECT REM PT RETURN 9FT ADLT (ELECTROSURGICAL) ×2
ELECTRODE REM PT RTRN 9FT ADLT (ELECTROSURGICAL) ×1 IMPLANT
GLOVE BIO SURGEON STRL SZ8 (GLOVE) ×2 IMPLANT
GLOVE BIOGEL PI IND STRL 8 (GLOVE) ×1 IMPLANT
GLOVE BIOGEL PI INDICATOR 8 (GLOVE) ×1
GOWN STRL REUS W/ TWL LRG LVL3 (GOWN DISPOSABLE) ×2 IMPLANT
GOWN STRL REUS W/ TWL XL LVL3 (GOWN DISPOSABLE) ×1 IMPLANT
GOWN STRL REUS W/TWL LRG LVL3 (GOWN DISPOSABLE) ×4
GOWN STRL REUS W/TWL XL LVL3 (GOWN DISPOSABLE) ×2
HEMOSTAT ARISTA ABSORB 3G PWDR (HEMOSTASIS) ×1 IMPLANT
KIT BASIN OR (CUSTOM PROCEDURE TRAY) ×2 IMPLANT
KIT TURNOVER KIT B (KITS) ×2 IMPLANT
NS IRRIG 1000ML POUR BTL (IV SOLUTION) ×2 IMPLANT
PAD ARMBOARD 7.5X6 YLW CONV (MISCELLANEOUS) ×2 IMPLANT
POUCH RETRIEVAL ECOSAC 10 (ENDOMECHANICALS) ×1 IMPLANT
POUCH RETRIEVAL ECOSAC 10MM (ENDOMECHANICALS) ×2
SCISSORS LAP 5X35 DISP (ENDOMECHANICALS) ×2 IMPLANT
SET IRRIG TUBING LAPAROSCOPIC (IRRIGATION / IRRIGATOR) ×2 IMPLANT
SET TUBE SMOKE EVAC HIGH FLOW (TUBING) ×2 IMPLANT
SLEEVE ENDOPATH XCEL 5M (ENDOMECHANICALS) ×2 IMPLANT
SPECIMEN JAR SMALL (MISCELLANEOUS) ×2 IMPLANT
SUT MNCRL AB 4-0 PS2 18 (SUTURE) ×2 IMPLANT
SUT VICRYL 0 UR6 27IN ABS (SUTURE) ×2 IMPLANT
TOWEL GREEN STERILE (TOWEL DISPOSABLE) ×2 IMPLANT
TOWEL GREEN STERILE FF (TOWEL DISPOSABLE) ×2 IMPLANT
TRAY LAPAROSCOPIC MC (CUSTOM PROCEDURE TRAY) ×2 IMPLANT
TROCAR XCEL BLUNT TIP 100MML (ENDOMECHANICALS) ×2 IMPLANT
TROCAR XCEL NON-BLD 11X100MML (ENDOMECHANICALS) ×2 IMPLANT
TROCAR XCEL NON-BLD 5MMX100MML (ENDOMECHANICALS) ×2 IMPLANT
WARMER LAPAROSCOPE (MISCELLANEOUS) ×2 IMPLANT

## 2022-04-16 NOTE — Anesthesia Postprocedure Evaluation (Signed)
Anesthesia Post Note ? ?Patient: Jessica Hull ? ?Procedure(s) Performed: LAPAROSCOPIC CHOLECYSTECTOMY ? ?  ? ?Patient location during evaluation: PACU ?Anesthesia Type: General ?Level of consciousness: awake and alert ?Pain management: pain level controlled ?Vital Signs Assessment: post-procedure vital signs reviewed and stable ?Respiratory status: spontaneous breathing, nonlabored ventilation and respiratory function stable ?Cardiovascular status: blood pressure returned to baseline and stable ?Postop Assessment: no apparent nausea or vomiting ?Anesthetic complications: no ? ? ?No notable events documented. ? ?Last Vitals:  ?Vitals:  ? 04/16/22 0925 04/16/22 0940  ?BP: (!) 157/92 (!) 150/58  ?Pulse: 65 66  ?Resp: 12 16  ?Temp:  36.7 ?C  ?SpO2: 97% 96%  ?  ?Last Pain:  ?Vitals:  ? 04/16/22 0940  ?TempSrc:   ?PainSc: 2   ? ? ?  ?  ?  ?  ?  ?  ? ?Lynda Rainwater ? ? ? ? ?

## 2022-04-16 NOTE — Discharge Instructions (Signed)
[facility] ______[facility] LAPAROSCOPIC SURGERY: POST OP INSTRUCTIONS Always review your discharge instruction sheet given to you by the facility where your surgery was performed. IF YOU HAVE DISABILITY OR FAMILY LEAVE FORMS, YOU MUST BRING THEM TO THE OFFICE FOR PROCESSING.   DO NOT GIVE THEM TO YOUR DOCTOR.  A prescription for pain medication may be given to you upon discharge.  Take your pain medication as prescribed, if needed.  If narcotic pain medicine is not needed, then you may take acetaminophen (Tylenol) or ibuprofen (Advil) as needed. Take your usually prescribed medications unless otherwise directed. If you need a refill on your pain medication, please contact your pharmacy.  They will contact our office to request authorization. Prescriptions will not be filled after 5pm or on week-ends. You should follow a light diet the first few days after arrival home, such as soup and crackers, etc.  Be sure to include lots of fluids daily. Most patients will experience some swelling and bruising in the area of the incisions.  Ice packs will help.  Swelling and bruising can take several days to resolve.  It is common to experience some constipation if taking pain medication after surgery.  Increasing fluid intake and taking a stool softener (such as Colace) will usually help or prevent this problem from occurring.  A mild laxative (Milk of Magnesia or Miralax) should be taken according to package instructions if there are no bowel movements after 48 hours. Unless discharge instructions indicate otherwise, you may remove your bandages 24-48 hours after surgery, and you may shower at that time.  You may have steri-strips (small skin tapes) in place directly over the incision.  These strips should be left on the skin for 7-10 days.  If your surgeon used skin glue on the incision, you may shower in 24 hours.  The glue will flake off over the next 2-3 weeks.  Any sutures or staples will be  removed at the office during your follow-up visit. ACTIVITIES:  You may resume regular (light) daily activities beginning the next day--such as daily self-care, walking, climbing stairs--gradually increasing activities as tolerated.  You may have sexual intercourse when it is comfortable.  Refrain from any heavy lifting or straining until approved by your doctor. You may drive when you are no longer taking prescription pain medication, you can comfortably wear a seatbelt, and you can safely maneuver your car and apply brakes. RETURN TO WORK:  __________________________________________________________ You should see your doctor in the office for a follow-up appointment approximately 2-3 weeks after your surgery.  Make sure that you call for this appointment within a day or two after you arrive home to insure a convenient appointment time. OTHER INSTRUCTIONS: __________________________________________________________________________________________________________________________ __________________________________________________________________________________________________________________________ WHEN TO CALL YOUR DOCTOR: Fever over 101.0 Inability to urinate Continued bleeding from incision. Increased pain, redness, or drainage from the incision. Increasing abdominal pain  The clinic staff is available to answer your questions during regular business hours.  Please don't hesitate to call and ask to speak to one of the nurses for clinical concerns.  If you have a medical emergency, go to the nearest emergency room or call 911.  A surgeon from [facility] is always on call at the hospital. [address], [address], [place], [place]  [address] ? [address] [address], [place], [place]   [address] [phone] ? [phone] ? FAX [phone] Web site: [email or web address]  

## 2022-04-16 NOTE — Anesthesia Procedure Notes (Addendum)
Procedure Name: Intubation ?Date/Time: 04/16/2022 7:39 AM ?Performed by: Michele Rockers, CRNA ?Pre-anesthesia Checklist: Patient identified, Patient being monitored, Timeout performed, Emergency Drugs available and Suction available ?Patient Re-evaluated:Patient Re-evaluated prior to induction ?Oxygen Delivery Method: Circle System Utilized ?Preoxygenation: Pre-oxygenation with 100% oxygen ?Induction Type: IV induction ?Ventilation: Mask ventilation without difficulty ?Laryngoscope Size: Mac and 3 ?Grade View: Grade I ?Tube type: Oral ?Tube size: 7.0 mm ?Number of attempts: 2 ?Airway Equipment and Method: Bougie stylet ?Placement Confirmation: ETT inserted through vocal cords under direct vision, positive ETCO2 and breath sounds checked- equal and bilateral ?Secured at: 21 cm ?Tube secured with: Tape ?Dental Injury: Teeth and Oropharynx as per pre-operative assessment  ?Difficulty Due To: Difficult Airway- due to anterior larynx ?Comments: VC barely visualized will Mac 3, boujie used to pass tube.  ? ? ? ? ?

## 2022-04-16 NOTE — Interval H&P Note (Signed)
History and Physical Interval Note: ? ?04/16/2022 ?7:14 AM ? ?Jessica Hull  has presented today for surgery, with the diagnosis of GALLSTONES.  The various methods of treatment have been discussed with the patient and family. After consideration of risks, benefits and other options for treatment, the patient has consented to  Procedure(s): ?LAPAROSCOPIC CHOLECYSTECTOMY WITH POSSIBLE INTRAOPERATIVE CHOLANGIOGRAM (N/A) as a surgical intervention.  The patient's history has been reviewed, patient examined, no change in status, stable for surgery.  I have reviewed the patient's chart and labs.  Questions were answered to the patient's satisfaction.   ? ? ?Ireene Ballowe A Annett Boxwell ? ? ?

## 2022-04-16 NOTE — Transfer of Care (Signed)
Immediate Anesthesia Transfer of Care Note ? ?Patient: Jessica Hull ? ?Procedure(s) Performed: LAPAROSCOPIC CHOLECYSTECTOMY ? ?Patient Location: PACU ? ?Anesthesia Type:General ? ?Level of Consciousness: awake, patient cooperative and pateint uncooperative ? ?Airway & Oxygen Therapy: Patient Spontanous Breathing and Patient connected to nasal cannula oxygen ? ?Post-op Assessment: Report given to RN and Post -op Vital signs reviewed and stable ? ?Post vital signs: Reviewed and stable ? ?Last Vitals:  ?Vitals Value Taken Time  ?BP    ?Temp    ?Pulse 128 04/16/22 0852  ?Resp 16 04/16/22 0852  ?SpO2 87 % 04/16/22 0852  ?Vitals shown include unvalidated device data. ? ?Last Pain:  ?Vitals:  ? 04/16/22 0631  ?TempSrc:   ?PainSc: 0-No pain  ?   ? ?  ? ?Complications: No notable events documented. ?

## 2022-04-16 NOTE — H&P (Signed)
History of Present Illness: ?Jessica Hull is a 86 y.o. female who is seen today as an office consultation for evaluation of Cholelithiasis ?.  ? ?Seen for evaluation of epigastric abdominal pain. She has had it for couple years. She is getting more pain down her epigastrium and right upper quadrant. It comes and goes. She also has some fullness in which she describes as reflux-like symptoms with it. She is unclear if food brings it on but she is unable to eat and sounds like it impacts how she eats. She had no vomiting. The pain is probably a 3 out of 4/10. Location right upper quadrant epigastrium. Does limit her appetite. Ultrasounds done which showed a single gallstone or possible small gallbladder polyp. She has undergone extensive GI work-up including upper endoscopy which was normal. She had a gastric emptying study in the past which was normal. ? ?Review of Systems: ?A complete review of systems was obtained from the patient. I have reviewed this information and discussed as appropriate with the patient. See HPI as well for other ROS. ? ? ? ?Medical History: ?Past Medical History:  ?Diagnosis Date  ? Arthritis  ? Asthma, unspecified asthma severity, unspecified whether complicated, unspecified whether persistent  ? ?There is no problem list on file for this patient. ? ?History reviewed. No pertinent surgical history.  ? ?Allergies  ?Allergen Reactions  ? Azithromycin Other (See Comments)  ?Blurry vision  ? Rocephin [Ceftriaxone] Syncope  ? Amoxicillin Hives and Other (See Comments)  ? ?Current Outpatient Medications on File Prior to Visit  ?Medication Sig Dispense Refill  ? calcium carbonate 500 mg calcium (1,250 mg) tablet 1 tablet with meals  ? calcium carbonate-vitamin D3 500 mg-3.125 mcg (125 unit) per tablet Take 1 tablet by mouth once daily  ? ezetimibe (ZETIA) 10 mg tablet Take 10 mg by mouth once daily  ? metoprolol tartrate (LOPRESSOR) 25 MG tablet metoprolol tartrate 25 mg tablet ?TAKE 1 TABLET BY  MOUTH WITH FOOD TWICE DAILY  ? pantoprazole (PROTONIX) 40 MG DR tablet pantoprazole 40 mg tablet,delayed release ?TAKE 1 TABLET BY MOUTH ONCE DAILY  ? predniSONE (DELTASONE) 20 MG tablet prednisone 20 mg tablet ?TAKE 1 TABLET BY MOUTH EVERY DAY IN THE MORNING FOR 5 DAYS  ? rosuvastatin (CRESTOR) 5 MG tablet rosuvastatin 5 mg tablet ?TAKE 1 TABLET BY MOUTH ONCE A WEEK  ? ?No current facility-administered medications on file prior to visit.  ? ?History reviewed. No pertinent family history.  ? ?Social History  ? ?Tobacco Use  ?Smoking Status Never  ?Smokeless Tobacco Never  ? ? ?Social History  ? ?Socioeconomic History  ? Marital status: Widowed  ?Tobacco Use  ? Smoking status: Never  ? Smokeless tobacco: Never  ? ?Objective:  ? ?Vitals:  ?03/17/22 0959  ?BP: (!) 150/86  ?Pulse: (!) 111  ?Weight: 47 kg (103 lb 9.6 oz)  ?Height: 152.4 cm (5')  ? ?Body mass index is 20.23 kg/m?. ? ?Physical Exam ?Eyes:  ?General: No scleral icterus. ?Pupils: Pupils are equal, round, and reactive to light.  ?Cardiovascular:  ?Rate and Rhythm: Normal rate.  ?Pulmonary:  ?Effort: Pulmonary effort is normal.  ?Breath sounds: No stridor.  ?Abdominal:  ?General: Abdomen is flat.  ?Palpations: There is no mass.  ?Tenderness: There is no abdominal tenderness.  ?Hernia: No hernia is present.  ?Musculoskeletal:  ?General: Normal range of motion.  ?Cervical back: Normal range of motion.  ?Neurological:  ?General: No focal deficit present.  ?Mental Status: She is alert.  ?  Psychiatric:  ?Mood and Affect: Mood normal.  ?Behavior: Behavior normal.  ? ? ? ?Labs, Imaging and Diagnostic Testing: ?Abdominal pain. Back pain. ?  ?EXAM: ?ABDOMEN ULTRASOUND COMPLETE ?  ?COMPARISON: 01/09/2020 CT ?  ?FINDINGS: ?Gallbladder: Favor gallstones over gallbladder polyps. Maximally 6 ?mm. No wall thickening or pericholecystic fluid. Sonographic ?Murphy's sign was not elicited. ?  ?Common bile duct: Diameter: Normal, 5 mm. ?  ?Liver: Innumerable hepatic cysts and bile  duct hamartomas, as on ?prior CT. Some demonstrate mild complexity. Portal vein is patent on ?color Doppler imaging with normal direction of blood flow towards ?the liver. ?  ?IVC: No abnormality visualized. ?  ?Pancreas: Visualized portion unremarkable. ?  ?Spleen: Size and appearance within normal limits. ?  ?Right Kidney: Length: 10.2 cm. Echogenicity within normal limits. No ?mass or hydronephrosis visualized. ?  ?Left Kidney: Length: 10.6 cm. Lower pole left renal 1.4 cm cyst or ?minimally complex cyst. ?  ?Upper pole 1.5 cm left renal cyst or minimally complex cyst. No ?hydronephrosis. ?  ?Abdominal aorta: No aneurysm visualized. ?  ?Other findings: No ascites. ?  ?IMPRESSION: ?Gallstones versus less likely gallbladder polyps. No evidence of ?acute cholecystitis. ?  ?No other explanation for patient's symptoms. ?  ?  ?Electronically Signed ?By: Abigail Miyamoto M.D. ?On: 02/23/2022 17:07 ? ?Assessment and Plan:  ? ?Diagnoses and all orders for this visit: ? ?Calculus of gallbladder with chronic cholecystitis without obstruction ? ? ? ?Gust the pros and cons of cholecystectomy in her circumstance. Unclear if all her symptoms are because of her gallbladder but certainly she has no symptoms with a stone and pain to discuss cholecystectomy. I think will help her and quoted probably about a 75% chance of relieving her symptoms completely. I think worst-case she will feel better but may still have some of these reflux and other symptoms since she could have other issues going on. We discussed complications of surgery the pros and cons of surgery as well as observing this and modifying her diet. She opted to have cholecystectomy and discussed laparoscopic cholecystectomy with her today. Risk of bleeding, infection, organ injury, common bile duct injury, cardiovascular risk, postop expectations, postop diet as well as expected recovery discussed. She has agreed to proceed with surgery. ? ?No follow-ups on file. ? ?Kennieth Francois, MD  ?

## 2022-04-16 NOTE — Op Note (Signed)
Laparoscopic Cholecystectomy  Procedure Note ? ?Indications: This patient presents with symptomatic gallbladder disease and will undergo laparoscopic cholecystectomy.The procedure has been discussed with the patient. Operative and non operative treatments have been discussed. Risks of surgery include bleeding, infection,  Common bile duct injury,  Injury to the stomach,liver, colon,small intestine, abdominal wall,  Diaphragm,  Major blood vessels,  And the need for an open procedure.  Other risks include worsening of medical problems, death,  DVT and pulmonary embolism, and cardiovascular events.   Medical options have also been discussed. The patient has been informed of long term expectations of surgery and non surgical options,  The patient agrees to proceed.    ? ?Pre-operative Diagnosis: Calculus of gallbladder without mention of cholecystitis or obstruction ? ?Post-operative Diagnosis: Same ? ?Surgeon: Turner Daniels  MD  ? ?Assistants: OR staff ? ?Anesthesia: General endotracheal anesthesia and Local anesthesia 0.25.% bupivacaine, with epinephrine ? ?ASA Class: 2 ? ?Procedure Details  ?The patient was seen again in the Holding Room. The risks, benefits, complications, treatment options, and expected outcomes were discussed with the patient. The possibilities of reaction to medication, pulmonary aspiration, perforation of viscus, bleeding, recurrent infection, finding a normal gallbladder, the need for additional procedures, failure to diagnose a condition, the possible need to convert to an open procedure, and creating a complication requiring transfusion or operation were discussed with the patient. The patient and/or family concurred with the proposed plan, giving informed consent. The site of surgery properly noted/marked. The patient was taken to Operating Room, identified as Jessica Hull and the procedure verified as Laparoscopic Cholecystectomy with Intraoperative Cholangiograms. A Time Out was held  and the above information confirmed. ? ?Prior to the induction of general anesthesia, antibiotic prophylaxis was administered. General endotracheal anesthesia was then administered and tolerated well. After the induction, the abdomen was prepped in the usual sterile fashion. The patient was positioned in the supine position with the left arm comfortably tucked, along with some reverse Trendelenburg. ? ?Local anesthetic agent was injected into the skin near the umbilicus and an incision made. The midline fascia was incised and the Hasson technique was used to introduce a 12 mm port under direct vision. It was secured with a figure of eight Vicryl suture placed in the usual fashion. Pneumoperitoneum was then created with CO2 and tolerated well without any adverse changes in the patient's vital signs. Additional trocars were introduced under direct vision with an 11 mm trocar in the epigastrium and 2 5 mm trocars in the right upper quadrant. All skin incisions were infiltrated with a local anesthetic agent before making the incision and placing the trocars.  ? ?The gallbladder was identified, the fundus grasped and retracted cephalad. Adhesions were lysed bluntly and with the electrocautery where indicated, taking care not to injure any adjacent organs or viscus. The infundibulum was grasped and retracted laterally, exposing the peritoneum overlying the triangle of Calot. This was then divided and exposed in a blunt fashion. The cystic duct was clearly identified and bluntly dissected circumferentially. The junctions of the gallbladder, cystic duct and common bile duct were clearly identified prior to the division of any linear structure.  ? ?The critical view was obtained and a complete 30 degree view of the cystic duct obtained. The cystic duct was quite small and would not accommodate a catheter.  Her LFT and U/S showed no signs of common bile duct obstruction. Cholangiogram  was not performed.   ? ?The cystic duct  was then  ligated with surgical clips  on the patient side and  clipped on the gallbladder side and divided. The cystic artery was identified, dissected free, ligated with clips and divided as well. Posterior cystic artery clipped and divided. ? ?The gallbladder was dissected from the liver bed in retrograde fashion with the electrocautery. The gallbladder was removed with the Ecosac.  The liver bed was irrigated and inspected. Hemostasis was achieved with the electrocautery. Copious irrigation was utilized and was repeatedly aspirated until clear all particulate matter. Hemostasis was achieved with no signs  Of bleeding or bile leakage. Arista was placed into the gallbladder fossa.  ? ?Pneumoperitoneum was completely reduced after viewing removal of the trocars under direct vision. The wound was thoroughly irrigated and the fascia was then closed with a figure of eight suture of 0 vicryl; the skin was then closed with 4 O monocryl and a sterile dressing of Dermabond was applied. ? ?Instrument, sponge, and needle counts were correct at closure and at the conclusion of the case.  ? ?Findings: ?  Cholelithiasis ? ?Estimated Blood Loss: less than 50 mL ?        ?Drains: none ?        ?Total IV Fluids:per OR record ?        ?Specimens: Gallbladder     ?      ?Complications: None; patient tolerated the procedure well. ?        ?Disposition: PACU - hemodynamically stable. ?        ?Condition: stable ?  ?

## 2022-04-16 NOTE — Anesthesia Preprocedure Evaluation (Signed)
Anesthesia Evaluation  ?Patient identified by MRN, date of birth, ID band ?Patient awake ? ? ? ?Reviewed: ?Allergy & Precautions, NPO status , Patient's Chart, lab work & pertinent test results ? ?Airway ?Mallampati: II ? ?TM Distance: >3 FB ?Neck ROM: Full ? ? ? Dental ?no notable dental hx. ? ?  ?Pulmonary ?neg pulmonary ROS,  ?  ?Pulmonary exam normal ?breath sounds clear to auscultation ? ? ? ? ? ? Cardiovascular ?hypertension, Pt. on medications ?negative cardio ROS ?Normal cardiovascular exam ?Rhythm:Regular Rate:Normal ? ? ?  ?Neuro/Psych ?negative neurological ROS ? negative psych ROS  ? GI/Hepatic ?negative GI ROS, Neg liver ROS,   ?Endo/Other  ?negative endocrine ROS ? Renal/GU ?negative Renal ROS  ?negative genitourinary ?  ?Musculoskeletal ?negative musculoskeletal ROS ?(+)  ? Abdominal ?  ?Peds ?negative pediatric ROS ?(+)  Hematology ?negative hematology ROS ?(+)   ?Anesthesia Other Findings ? ? Reproductive/Obstetrics ?negative OB ROS ? ?  ? ? ? ? ? ? ? ? ? ? ? ? ? ?  ?  ? ? ? ? ? ? ? ? ?Anesthesia Physical ?Anesthesia Plan ? ?ASA: 2 ? ?Anesthesia Plan: General  ? ?Post-op Pain Management:   ? ?Induction: Intravenous ? ?PONV Risk Score and Plan: 3 and Ondansetron, Dexamethasone, Midazolam and Treatment may vary due to age or medical condition ? ?Airway Management Planned: Oral ETT ? ?Additional Equipment:  ? ?Intra-op Plan:  ? ?Post-operative Plan: Extubation in OR ? ?Informed Consent: I have reviewed the patients History and Physical, chart, labs and discussed the procedure including the risks, benefits and alternatives for the proposed anesthesia with the patient or authorized representative who has indicated his/her understanding and acceptance.  ? ? ? ?Dental advisory given ? ?Plan Discussed with: CRNA ? ?Anesthesia Plan Comments:   ? ? ? ? ? ? ?Anesthesia Quick Evaluation ? ?

## 2022-04-17 ENCOUNTER — Encounter (HOSPITAL_COMMUNITY): Payer: Self-pay | Admitting: Surgery

## 2022-04-17 LAB — SURGICAL PATHOLOGY

## 2022-06-19 DIAGNOSIS — L259 Unspecified contact dermatitis, unspecified cause: Secondary | ICD-10-CM | POA: Diagnosis not present

## 2022-07-07 DIAGNOSIS — M25561 Pain in right knee: Secondary | ICD-10-CM | POA: Diagnosis not present

## 2022-07-07 DIAGNOSIS — J019 Acute sinusitis, unspecified: Secondary | ICD-10-CM | POA: Diagnosis not present

## 2022-07-22 DIAGNOSIS — S80861A Insect bite (nonvenomous), right lower leg, initial encounter: Secondary | ICD-10-CM | POA: Diagnosis not present

## 2022-07-22 DIAGNOSIS — W57XXXA Bitten or stung by nonvenomous insect and other nonvenomous arthropods, initial encounter: Secondary | ICD-10-CM | POA: Diagnosis not present

## 2022-08-03 DIAGNOSIS — M25561 Pain in right knee: Secondary | ICD-10-CM | POA: Diagnosis not present

## 2022-08-04 DIAGNOSIS — Z961 Presence of intraocular lens: Secondary | ICD-10-CM | POA: Diagnosis not present

## 2022-08-26 DIAGNOSIS — M25561 Pain in right knee: Secondary | ICD-10-CM | POA: Diagnosis not present

## 2022-08-26 DIAGNOSIS — G6289 Other specified polyneuropathies: Secondary | ICD-10-CM | POA: Diagnosis not present

## 2022-08-26 DIAGNOSIS — G629 Polyneuropathy, unspecified: Secondary | ICD-10-CM | POA: Diagnosis not present

## 2022-08-26 DIAGNOSIS — R5381 Other malaise: Secondary | ICD-10-CM | POA: Diagnosis not present

## 2022-08-26 DIAGNOSIS — Z23 Encounter for immunization: Secondary | ICD-10-CM | POA: Diagnosis not present

## 2022-09-02 DIAGNOSIS — I10 Essential (primary) hypertension: Secondary | ICD-10-CM | POA: Diagnosis not present

## 2022-09-02 DIAGNOSIS — M5416 Radiculopathy, lumbar region: Secondary | ICD-10-CM | POA: Diagnosis not present

## 2022-09-02 DIAGNOSIS — M461 Sacroiliitis, not elsewhere classified: Secondary | ICD-10-CM | POA: Diagnosis not present

## 2022-09-29 DIAGNOSIS — E7800 Pure hypercholesterolemia, unspecified: Secondary | ICD-10-CM | POA: Diagnosis not present

## 2022-09-29 DIAGNOSIS — R7301 Impaired fasting glucose: Secondary | ICD-10-CM | POA: Diagnosis not present

## 2022-09-29 DIAGNOSIS — Z79899 Other long term (current) drug therapy: Secondary | ICD-10-CM | POA: Diagnosis not present

## 2022-10-08 DIAGNOSIS — I700 Atherosclerosis of aorta: Secondary | ICD-10-CM | POA: Diagnosis not present

## 2022-10-08 DIAGNOSIS — M810 Age-related osteoporosis without current pathological fracture: Secondary | ICD-10-CM | POA: Diagnosis not present

## 2022-10-08 DIAGNOSIS — E7800 Pure hypercholesterolemia, unspecified: Secondary | ICD-10-CM | POA: Diagnosis not present

## 2022-10-08 DIAGNOSIS — I10 Essential (primary) hypertension: Secondary | ICD-10-CM | POA: Diagnosis not present

## 2022-10-08 DIAGNOSIS — Z0000 Encounter for general adult medical examination without abnormal findings: Secondary | ICD-10-CM | POA: Diagnosis not present

## 2022-10-08 DIAGNOSIS — Z6820 Body mass index (BMI) 20.0-20.9, adult: Secondary | ICD-10-CM | POA: Diagnosis not present

## 2023-08-18 DIAGNOSIS — E7800 Pure hypercholesterolemia, unspecified: Secondary | ICD-10-CM | POA: Diagnosis not present

## 2023-08-18 DIAGNOSIS — Z681 Body mass index (BMI) 19 or less, adult: Secondary | ICD-10-CM | POA: Diagnosis not present

## 2023-08-18 DIAGNOSIS — I10 Essential (primary) hypertension: Secondary | ICD-10-CM | POA: Diagnosis not present

## 2023-08-20 DIAGNOSIS — I10 Essential (primary) hypertension: Secondary | ICD-10-CM | POA: Diagnosis not present
